# Patient Record
Sex: Female | Born: 1980 | Hispanic: No | Marital: Single | Smoking: Former smoker
Health system: Southern US, Community
[De-identification: ages and names within clinical notes are randomized; demographics above are authoritative.]

## PROBLEM LIST (undated history)

## (undated) ENCOUNTER — Inpatient Hospital Stay (HOSPITAL_COMMUNITY): Payer: Self-pay

## (undated) DIAGNOSIS — N390 Urinary tract infection, site not specified: Secondary | ICD-10-CM

## (undated) DIAGNOSIS — N83209 Unspecified ovarian cyst, unspecified side: Secondary | ICD-10-CM

## (undated) DIAGNOSIS — N739 Female pelvic inflammatory disease, unspecified: Secondary | ICD-10-CM

## (undated) DIAGNOSIS — T7840XA Allergy, unspecified, initial encounter: Secondary | ICD-10-CM

## (undated) DIAGNOSIS — Z309 Encounter for contraceptive management, unspecified: Secondary | ICD-10-CM

## (undated) DIAGNOSIS — D649 Anemia, unspecified: Secondary | ICD-10-CM

## (undated) DIAGNOSIS — A749 Chlamydial infection, unspecified: Secondary | ICD-10-CM

## (undated) HISTORY — DX: Unspecified ovarian cyst, unspecified side: N83.209

## (undated) HISTORY — PX: DILATION AND CURETTAGE OF UTERUS: SHX78

## (undated) HISTORY — PX: TONSILLECTOMY: SUR1361

## (undated) HISTORY — DX: Allergy, unspecified, initial encounter: T78.40XA

## (undated) HISTORY — DX: Anemia, unspecified: D64.9

## (undated) HISTORY — DX: Urinary tract infection, site not specified: N39.0

## (undated) HISTORY — PX: HERNIA REPAIR: SHX51

## (undated) HISTORY — DX: Encounter for contraceptive management, unspecified: Z30.9

## (undated) HISTORY — DX: Female pelvic inflammatory disease, unspecified: N73.9

## (undated) HISTORY — DX: Chlamydial infection, unspecified: A74.9

---
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
## 2010-07-01 ENCOUNTER — Inpatient Hospital Stay (HOSPITAL_COMMUNITY)
Admission: AD | Admit: 2010-07-01 | Discharge: 2010-07-01 | Disposition: A | Discharge status: Home / Self Care | Payer: BC Managed Care – PPO | Source: Ambulatory Visit | Attending: Obstetrics and Gynecology | Admitting: Obstetrics and Gynecology

## 2010-07-01 DIAGNOSIS — N949 Unspecified condition associated with female genital organs and menstrual cycle: Secondary | ICD-10-CM | POA: Insufficient documentation

## 2010-07-01 DIAGNOSIS — O2390 Unspecified genitourinary tract infection in pregnancy, unspecified trimester: Secondary | ICD-10-CM | POA: Insufficient documentation

## 2010-07-01 DIAGNOSIS — A499 Bacterial infection, unspecified: Secondary | ICD-10-CM

## 2010-07-01 DIAGNOSIS — N760 Acute vaginitis: Secondary | ICD-10-CM

## 2010-07-01 DIAGNOSIS — B9689 Other specified bacterial agents as the cause of diseases classified elsewhere: Secondary | ICD-10-CM | POA: Insufficient documentation

## 2010-07-01 LAB — URINALYSIS, ROUTINE W REFLEX MICROSCOPIC
Bilirubin Urine: NEGATIVE
Ketones, ur: NEGATIVE mg/dL
Specific Gravity, Urine: 1.02 (ref 1.005–1.030)
pH: 6.5 (ref 5.0–8.0)

## 2010-07-01 LAB — CBC
Platelets: 255 10*3/uL (ref 150–400)
RBC: 3.38 MIL/uL — ABNORMAL LOW (ref 3.87–5.11)
WBC: 10.1 10*3/uL (ref 4.0–10.5)

## 2010-07-01 LAB — URINE MICROSCOPIC-ADD ON

## 2010-07-12 ENCOUNTER — Other Ambulatory Visit: Payer: Self-pay | Admitting: Obstetrics and Gynecology

## 2010-07-12 ENCOUNTER — Other Ambulatory Visit: Payer: Self-pay

## 2010-07-12 DIAGNOSIS — O0930 Supervision of pregnancy with insufficient antenatal care, unspecified trimester: Secondary | ICD-10-CM

## 2010-07-12 DIAGNOSIS — O2690 Pregnancy related conditions, unspecified, unspecified trimester: Secondary | ICD-10-CM

## 2010-07-12 LAB — POCT URINALYSIS DIPSTICK
Bilirubin Urine: NEGATIVE
Glucose, UA: NEGATIVE mg/dL
Ketones, ur: NEGATIVE mg/dL
Specific Gravity, Urine: 1.02 (ref 1.005–1.030)

## 2010-07-19 ENCOUNTER — Encounter: Payer: Self-pay | Admitting: Obstetrics & Gynecology

## 2010-07-19 ENCOUNTER — Other Ambulatory Visit: Payer: Self-pay

## 2010-07-19 DIAGNOSIS — O09299 Supervision of pregnancy with other poor reproductive or obstetric history, unspecified trimester: Secondary | ICD-10-CM

## 2010-07-19 LAB — POCT URINALYSIS DIPSTICK
Bilirubin Urine: NEGATIVE
Glucose, UA: NEGATIVE mg/dL
Hgb urine dipstick: NEGATIVE
Ketones, ur: NEGATIVE mg/dL
Nitrite: NEGATIVE
Specific Gravity, Urine: 1.015 (ref 1.005–1.030)

## 2010-08-02 ENCOUNTER — Other Ambulatory Visit: Payer: Self-pay | Admitting: Obstetrics & Gynecology

## 2010-08-02 ENCOUNTER — Inpatient Hospital Stay (HOSPITAL_COMMUNITY)
Admission: AD | Admit: 2010-08-02 | Discharge: 2010-08-02 | Disposition: A | Discharge status: Home / Self Care | Payer: 59 | Source: Ambulatory Visit | Attending: Obstetrics & Gynecology | Admitting: Obstetrics & Gynecology

## 2010-08-02 ENCOUNTER — Encounter: Payer: Self-pay | Admitting: Family Medicine

## 2010-08-02 DIAGNOSIS — O4700 False labor before 37 completed weeks of gestation, unspecified trimester: Secondary | ICD-10-CM

## 2010-08-02 DIAGNOSIS — O09299 Supervision of pregnancy with other poor reproductive or obstetric history, unspecified trimester: Secondary | ICD-10-CM

## 2010-08-02 DIAGNOSIS — O368190 Decreased fetal movements, unspecified trimester, not applicable or unspecified: Secondary | ICD-10-CM

## 2010-08-02 DIAGNOSIS — O09219 Supervision of pregnancy with history of pre-term labor, unspecified trimester: Secondary | ICD-10-CM

## 2010-08-02 LAB — CONVERTED CEMR LAB
HCT: 32.7 % — ABNORMAL LOW (ref 36.0–46.0)
HIV: NONREACTIVE
MCHC: 32.1 g/dL (ref 30.0–36.0)
MCV: 90.3 fL (ref 78.0–100.0)
Platelets: 322 10*3/uL (ref 150–400)
RBC: 3.62 M/uL — ABNORMAL LOW (ref 3.87–5.11)

## 2010-08-02 LAB — URINALYSIS, ROUTINE W REFLEX MICROSCOPIC
Bilirubin Urine: NEGATIVE
Nitrite: NEGATIVE
Specific Gravity, Urine: 1.01 (ref 1.005–1.030)
pH: 6 (ref 5.0–8.0)

## 2010-08-02 LAB — POCT URINALYSIS DIP (DEVICE)
Bilirubin Urine: NEGATIVE
Ketones, ur: NEGATIVE mg/dL
pH: 8 (ref 5.0–8.0)

## 2010-08-03 ENCOUNTER — Other Ambulatory Visit: Payer: Self-pay | Admitting: Obstetrics and Gynecology

## 2010-08-03 ENCOUNTER — Ambulatory Visit (HOSPITAL_COMMUNITY)
Admission: RE | Admit: 2010-08-03 | Discharge: 2010-08-03 | Disposition: A | Discharge status: Home / Self Care | Payer: 59 | Source: Ambulatory Visit | Attending: Obstetrics and Gynecology | Admitting: Obstetrics and Gynecology

## 2010-08-03 ENCOUNTER — Other Ambulatory Visit (HOSPITAL_COMMUNITY): Payer: 59

## 2010-08-03 DIAGNOSIS — O358XX0 Maternal care for other (suspected) fetal abnormality and damage, not applicable or unspecified: Secondary | ICD-10-CM | POA: Insufficient documentation

## 2010-08-03 DIAGNOSIS — O09299 Supervision of pregnancy with other poor reproductive or obstetric history, unspecified trimester: Secondary | ICD-10-CM

## 2010-08-03 DIAGNOSIS — O2690 Pregnancy related conditions, unspecified, unspecified trimester: Secondary | ICD-10-CM

## 2010-08-03 DIAGNOSIS — O352XX0 Maternal care for (suspected) hereditary disease in fetus, not applicable or unspecified: Secondary | ICD-10-CM

## 2010-08-03 DIAGNOSIS — O09219 Supervision of pregnancy with history of pre-term labor, unspecified trimester: Secondary | ICD-10-CM | POA: Insufficient documentation

## 2010-08-03 DIAGNOSIS — Z363 Encounter for antenatal screening for malformations: Secondary | ICD-10-CM | POA: Insufficient documentation

## 2010-08-03 DIAGNOSIS — Z1389 Encounter for screening for other disorder: Secondary | ICD-10-CM | POA: Insufficient documentation

## 2010-08-06 ENCOUNTER — Inpatient Hospital Stay (HOSPITAL_COMMUNITY)
Admission: AD | Admit: 2010-08-06 | Discharge: 2010-08-06 | Disposition: A | Discharge status: Home / Self Care | Payer: 59 | Source: Ambulatory Visit | Attending: Obstetrics & Gynecology | Admitting: Obstetrics & Gynecology

## 2010-08-06 ENCOUNTER — Other Ambulatory Visit: Payer: 59

## 2010-08-06 DIAGNOSIS — R000 Tachycardia, unspecified: Secondary | ICD-10-CM

## 2010-08-06 DIAGNOSIS — O99891 Other specified diseases and conditions complicating pregnancy: Secondary | ICD-10-CM | POA: Insufficient documentation

## 2010-08-06 DIAGNOSIS — O9989 Other specified diseases and conditions complicating pregnancy, childbirth and the puerperium: Secondary | ICD-10-CM

## 2010-08-06 LAB — TSH: TSH: 0.521 u[IU]/mL (ref 0.350–4.500)

## 2010-08-06 LAB — COMPREHENSIVE METABOLIC PANEL
BUN: 7 mg/dL (ref 6–23)
Calcium: 9.1 mg/dL (ref 8.4–10.5)
GFR calc non Af Amer: >60 mL/min (ref >60)
Glucose, Bld: 97 mg/dL (ref 70–99)
Total Protein: 6.5 g/dL (ref 6.0–8.3)

## 2010-08-06 LAB — URINALYSIS, ROUTINE W REFLEX MICROSCOPIC
Nitrite: NEGATIVE
Specific Gravity, Urine: 1.025 (ref 1.005–1.030)
pH: 6 (ref 5.0–8.0)

## 2010-08-06 LAB — CBC
MCV: 88.8 fL (ref 78.0–100.0)
Platelets: 270 10*3/uL (ref 150–400)
RBC: 3.4 MIL/uL — ABNORMAL LOW (ref 3.87–5.11)
WBC: 11.4 10*3/uL — ABNORMAL HIGH (ref 4.0–10.5)

## 2010-08-06 LAB — URINE MICROSCOPIC-ADD ON

## 2010-08-08 LAB — URINE CULTURE
Colony Count: 75000
Culture  Setup Time: 201204021514

## 2010-08-09 ENCOUNTER — Other Ambulatory Visit (HOSPITAL_COMMUNITY): Payer: 59

## 2010-08-09 ENCOUNTER — Other Ambulatory Visit: Payer: 59

## 2010-08-09 ENCOUNTER — Ambulatory Visit (HOSPITAL_COMMUNITY)
Admission: RE | Admit: 2010-08-09 | Discharge: 2010-08-09 | Disposition: A | Discharge status: Home / Self Care | Payer: 59 | Source: Ambulatory Visit | Attending: Obstetrics and Gynecology | Admitting: Obstetrics and Gynecology

## 2010-08-09 DIAGNOSIS — O09299 Supervision of pregnancy with other poor reproductive or obstetric history, unspecified trimester: Secondary | ICD-10-CM

## 2010-08-09 DIAGNOSIS — O2690 Pregnancy related conditions, unspecified, unspecified trimester: Secondary | ICD-10-CM

## 2010-08-16 ENCOUNTER — Ambulatory Visit (HOSPITAL_COMMUNITY): Payer: 59

## 2010-08-16 ENCOUNTER — Ambulatory Visit (HOSPITAL_COMMUNITY)
Admission: RE | Admit: 2010-08-16 | Discharge: 2010-08-16 | Disposition: A | Discharge status: Home / Self Care | Payer: 59 | Source: Ambulatory Visit | Attending: Obstetrics and Gynecology | Admitting: Obstetrics and Gynecology

## 2010-08-16 ENCOUNTER — Other Ambulatory Visit: Payer: Self-pay | Admitting: Family Medicine

## 2010-08-16 ENCOUNTER — Encounter (HOSPITAL_COMMUNITY): Payer: Self-pay

## 2010-08-16 ENCOUNTER — Other Ambulatory Visit: Payer: Self-pay | Admitting: Obstetrics & Gynecology

## 2010-08-16 DIAGNOSIS — O09219 Supervision of pregnancy with history of pre-term labor, unspecified trimester: Secondary | ICD-10-CM

## 2010-08-16 DIAGNOSIS — O09299 Supervision of pregnancy with other poor reproductive or obstetric history, unspecified trimester: Secondary | ICD-10-CM

## 2010-08-16 DIAGNOSIS — O352XX0 Maternal care for (suspected) hereditary disease in fetus, not applicable or unspecified: Secondary | ICD-10-CM | POA: Insufficient documentation

## 2010-08-16 LAB — POCT URINALYSIS DIP (DEVICE)
Hgb urine dipstick: NEGATIVE
Ketones, ur: NEGATIVE mg/dL
Protein, ur: NEGATIVE mg/dL
pH: 7 (ref 5.0–8.0)

## 2010-08-20 ENCOUNTER — Inpatient Hospital Stay (HOSPITAL_COMMUNITY)
Admission: AD | Admit: 2010-08-20 | Discharge: 2010-08-20 | Disposition: A | Discharge status: Home / Self Care | Payer: 59 | Source: Ambulatory Visit | Attending: Obstetrics & Gynecology | Admitting: Obstetrics & Gynecology

## 2010-08-20 DIAGNOSIS — O4700 False labor before 37 completed weeks of gestation, unspecified trimester: Secondary | ICD-10-CM | POA: Insufficient documentation

## 2010-08-20 LAB — URINE MICROSCOPIC-ADD ON

## 2010-08-20 LAB — URINALYSIS, ROUTINE W REFLEX MICROSCOPIC
Bilirubin Urine: NEGATIVE
Ketones, ur: NEGATIVE mg/dL
Nitrite: NEGATIVE
Specific Gravity, Urine: 1.01 (ref 1.005–1.030)
pH: 6.5 (ref 5.0–8.0)

## 2010-08-23 ENCOUNTER — Ambulatory Visit (HOSPITAL_COMMUNITY)
Admission: RE | Admit: 2010-08-23 | Discharge: 2010-08-23 | Disposition: A | Discharge status: Home / Self Care | Payer: 59 | Source: Ambulatory Visit | Attending: Obstetrics and Gynecology | Admitting: Obstetrics and Gynecology

## 2010-08-23 ENCOUNTER — Other Ambulatory Visit: Payer: Self-pay | Admitting: Obstetrics & Gynecology

## 2010-08-23 DIAGNOSIS — O09299 Supervision of pregnancy with other poor reproductive or obstetric history, unspecified trimester: Secondary | ICD-10-CM

## 2010-08-23 DIAGNOSIS — M79609 Pain in unspecified limb: Secondary | ICD-10-CM

## 2010-08-23 DIAGNOSIS — O09219 Supervision of pregnancy with history of pre-term labor, unspecified trimester: Secondary | ICD-10-CM | POA: Insufficient documentation

## 2010-08-23 DIAGNOSIS — O99891 Other specified diseases and conditions complicating pregnancy: Secondary | ICD-10-CM | POA: Insufficient documentation

## 2010-08-23 DIAGNOSIS — O352XX0 Maternal care for (suspected) hereditary disease in fetus, not applicable or unspecified: Secondary | ICD-10-CM | POA: Insufficient documentation

## 2010-08-23 DIAGNOSIS — M7989 Other specified soft tissue disorders: Secondary | ICD-10-CM | POA: Insufficient documentation

## 2010-08-23 LAB — POCT URINALYSIS DIP (DEVICE)
Bilirubin Urine: NEGATIVE
Ketones, ur: NEGATIVE mg/dL
Nitrite: NEGATIVE

## 2010-08-28 ENCOUNTER — Inpatient Hospital Stay (HOSPITAL_COMMUNITY)
Admission: AD | Admit: 2010-08-28 | Discharge: 2010-08-28 | Disposition: A | Discharge status: Home / Self Care | Payer: 59 | Source: Ambulatory Visit | Attending: Obstetrics & Gynecology | Admitting: Obstetrics & Gynecology

## 2010-08-28 DIAGNOSIS — O4700 False labor before 37 completed weeks of gestation, unspecified trimester: Secondary | ICD-10-CM | POA: Insufficient documentation

## 2010-08-28 DIAGNOSIS — D649 Anemia, unspecified: Secondary | ICD-10-CM | POA: Insufficient documentation

## 2010-08-28 DIAGNOSIS — O99019 Anemia complicating pregnancy, unspecified trimester: Secondary | ICD-10-CM | POA: Insufficient documentation

## 2010-08-28 LAB — CBC
Hemoglobin: 9.4 g/dL — ABNORMAL LOW (ref 12.0–15.0)
MCH: 28.7 pg (ref 26.0–34.0)
MCHC: 32.4 g/dL (ref 30.0–36.0)
Platelets: 296 10*3/uL (ref 150–400)
RBC: 3.28 MIL/uL — ABNORMAL LOW (ref 3.87–5.11)

## 2010-08-28 LAB — URINALYSIS, ROUTINE W REFLEX MICROSCOPIC
Bilirubin Urine: NEGATIVE
Ketones, ur: 15 mg/dL — AB
Nitrite: NEGATIVE
Specific Gravity, Urine: 1.01 (ref 1.005–1.030)
Urobilinogen, UA: 0.2 mg/dL (ref 0.0–1.0)
pH: 6 (ref 5.0–8.0)

## 2010-08-29 LAB — GLUCOSE, CAPILLARY: Glucose-Capillary: 112 mg/dL — ABNORMAL HIGH (ref 70–99)

## 2010-08-30 ENCOUNTER — Ambulatory Visit (HOSPITAL_COMMUNITY): Payer: 59

## 2010-08-30 ENCOUNTER — Ambulatory Visit (HOSPITAL_COMMUNITY)
Admission: RE | Admit: 2010-08-30 | Discharge: 2010-08-30 | Disposition: A | Discharge status: Home / Self Care | Payer: 59 | Source: Ambulatory Visit | Attending: Obstetrics and Gynecology | Admitting: Obstetrics and Gynecology

## 2010-08-30 ENCOUNTER — Other Ambulatory Visit: Payer: Self-pay | Admitting: Obstetrics and Gynecology

## 2010-08-30 DIAGNOSIS — O09219 Supervision of pregnancy with history of pre-term labor, unspecified trimester: Secondary | ICD-10-CM

## 2010-08-30 DIAGNOSIS — O09299 Supervision of pregnancy with other poor reproductive or obstetric history, unspecified trimester: Secondary | ICD-10-CM

## 2010-08-30 DIAGNOSIS — O352XX0 Maternal care for (suspected) hereditary disease in fetus, not applicable or unspecified: Secondary | ICD-10-CM | POA: Insufficient documentation

## 2010-08-30 DIAGNOSIS — Z331 Pregnant state, incidental: Secondary | ICD-10-CM

## 2010-09-03 ENCOUNTER — Other Ambulatory Visit: Payer: 59

## 2010-09-03 DIAGNOSIS — O09299 Supervision of pregnancy with other poor reproductive or obstetric history, unspecified trimester: Secondary | ICD-10-CM

## 2010-09-06 ENCOUNTER — Other Ambulatory Visit: Payer: 59

## 2010-09-06 ENCOUNTER — Other Ambulatory Visit: Payer: Self-pay | Admitting: Obstetrics and Gynecology

## 2010-09-06 DIAGNOSIS — O26879 Cervical shortening, unspecified trimester: Secondary | ICD-10-CM

## 2010-09-06 DIAGNOSIS — E669 Obesity, unspecified: Secondary | ICD-10-CM

## 2010-09-06 DIAGNOSIS — O09299 Supervision of pregnancy with other poor reproductive or obstetric history, unspecified trimester: Secondary | ICD-10-CM

## 2010-09-06 DIAGNOSIS — O99210 Obesity complicating pregnancy, unspecified trimester: Secondary | ICD-10-CM

## 2010-09-06 DIAGNOSIS — Z331 Pregnant state, incidental: Secondary | ICD-10-CM

## 2010-09-06 DIAGNOSIS — O09219 Supervision of pregnancy with history of pre-term labor, unspecified trimester: Secondary | ICD-10-CM

## 2010-09-06 LAB — POCT URINALYSIS DIP (DEVICE)
Glucose, UA: NEGATIVE mg/dL
Protein, ur: NEGATIVE mg/dL
Specific Gravity, Urine: >=1.03 (ref 1.005–1.030)
Urobilinogen, UA: 0.2 mg/dL (ref 0.0–1.0)

## 2010-09-07 ENCOUNTER — Encounter: Payer: Self-pay | Admitting: Internal Medicine

## 2010-09-07 ENCOUNTER — Encounter: Payer: Self-pay | Admitting: *Deleted

## 2010-09-10 ENCOUNTER — Other Ambulatory Visit: Payer: 59

## 2010-09-10 ENCOUNTER — Encounter: Payer: Self-pay | Admitting: Internal Medicine

## 2010-09-10 ENCOUNTER — Other Ambulatory Visit: Payer: Self-pay | Admitting: Obstetrics & Gynecology

## 2010-09-10 ENCOUNTER — Ambulatory Visit (INDEPENDENT_AMBULATORY_CARE_PROVIDER_SITE_OTHER): Payer: 59 | Admitting: Internal Medicine

## 2010-09-10 DIAGNOSIS — R42 Dizziness and giddiness: Secondary | ICD-10-CM

## 2010-09-10 DIAGNOSIS — Z331 Pregnant state, incidental: Secondary | ICD-10-CM

## 2010-09-10 DIAGNOSIS — R0602 Shortness of breath: Secondary | ICD-10-CM

## 2010-09-10 DIAGNOSIS — O09299 Supervision of pregnancy with other poor reproductive or obstetric history, unspecified trimester: Secondary | ICD-10-CM

## 2010-09-10 LAB — POCT URINALYSIS DIP (DEVICE)
Bilirubin Urine: NEGATIVE
Glucose, UA: NEGATIVE mg/dL
Nitrite: NEGATIVE
Urobilinogen, UA: 0.2 mg/dL (ref 0.0–1.0)

## 2010-09-10 NOTE — Progress Notes (Signed)
HPI Patient is a 30 year old who is referred for evaluation of palpitaitons and presyncope.. The patient is currently in the 33 wk of her pregnancy.  This is her 5th pregnancy.  Only one other went to term (2007).  She has not had problmes with the other pregnancies. She said about 1 month month she began feeling her heart race.  She has had dizziness.  One of the worst spells occurred when she was sitting at her desk at work.  Taken to ER.  Nothing found.  Sent home.  2nd spell of presyncope also occurred at work.  She has had other spells at home.  One occurred while standing. Episodes occur rapidly.  Prodrome is sudden warmth then dizziness.  Palpitations appear to follow.  No syncope. She says she is drinking aduaqute fluids. No Known Allergies  Current Outpatient Prescriptions  Medication Sig Dispense Refill  . ferrous sulfate 325 (65 FE) MG tablet Take 325 mg by mouth daily with breakfast.        . Prenatal MV-Min-Fe Fum-FA-DHA (PRENATAL 1 PO) 1 tab po qd         Past Medical History  Diagnosis Date  . Anemia   . Bipolar disorder   . UTI (urinary tract infection)   . Chlamydia   . PID (pelvic inflammatory disease)     Past Surgical History  Procedure Date  . Appendectomy   . Dilation and curettage of uterus   . Hernia repair   . Tonsillectomy     Family History  Problem Relation Age of Onset  . Depression    . Cancer    . Thyroid disease      History   Social History  . Marital Status: Single    Spouse Name: N/A    Number of Children: N/A  . Years of Education: N/A   Occupational History  . Not on file.   Social History Main Topics  . Smoking status: Former Smoker    Quit date: 05/06/2005  . Smokeless tobacco: Not on file  . Alcohol Use: No  . Drug Use: No  . Sexually Active: Not on file   Other Topics Concern  . Not on file   Social History Narrative  . No narrative on file    Review of Systems:  All systems reviewed.  They are negative to the  above problem except as previously stated.  EKG:  St  102 bpm.  Vital Signs: BP 124/73  Pulse 124  Ht 5\' 7"  (1.702 m)  Wt 214 lb (97.07 kg)  BMI 33.52 kg/m2  Physical Exam Patient is in NAD  HEENT:  Normocephalic, atraumatic. EOMI, PERRLA.  Neck: JVP is normal. No thyromegaly. No bruits.  Lungs: clear to auscultation. No rales no wheezes.  Heart: Regular rate and rhythm. Normal S1, S2. No S3.   No significant murmurs. PMI not displaced.  Abdomen:  Distended with pregnancy.. Normal bowel sounds. . No hepatomegaly.  Extremities:   Good distal pulses throughout. No lower extremity edema.  Musculoskeletal :moving all extremities.  Neuro:   alert and oriented x3.  CN II-XII grossly intact.   Assessment and Plan:

## 2010-09-10 NOTE — Patient Instructions (Signed)
Your physician has requested that you have an echocardiogram. Echocardiography is a painless test that uses sound waves to create images of your heart. It provides your doctor with information about the size and shape of your heart and how well your heart's chambers and valves are working. This procedure takes approximately one hour. There are no restrictions for this procedure.  We will call you with the results of the test.  Your physician recommends that you schedule a follow-up appointment in: July 2012

## 2010-09-13 ENCOUNTER — Other Ambulatory Visit: Payer: 59

## 2010-09-13 ENCOUNTER — Other Ambulatory Visit: Payer: Self-pay | Admitting: Physician Assistant

## 2010-09-13 DIAGNOSIS — Z331 Pregnant state, incidental: Secondary | ICD-10-CM

## 2010-09-13 DIAGNOSIS — O09299 Supervision of pregnancy with other poor reproductive or obstetric history, unspecified trimester: Secondary | ICD-10-CM

## 2010-09-13 LAB — POCT URINALYSIS DIP (DEVICE)
Bilirubin Urine: NEGATIVE
Glucose, UA: NEGATIVE mg/dL
Ketones, ur: NEGATIVE mg/dL
Nitrite: NEGATIVE
pH: 7 (ref 5.0–8.0)

## 2010-09-17 ENCOUNTER — Other Ambulatory Visit: Payer: 59

## 2010-09-17 DIAGNOSIS — O09299 Supervision of pregnancy with other poor reproductive or obstetric history, unspecified trimester: Secondary | ICD-10-CM

## 2010-09-18 ENCOUNTER — Ambulatory Visit (HOSPITAL_COMMUNITY): Payer: 59 | Attending: Internal Medicine

## 2010-09-18 DIAGNOSIS — R0609 Other forms of dyspnea: Secondary | ICD-10-CM | POA: Insufficient documentation

## 2010-09-18 DIAGNOSIS — I059 Rheumatic mitral valve disease, unspecified: Secondary | ICD-10-CM | POA: Insufficient documentation

## 2010-09-18 DIAGNOSIS — R0989 Other specified symptoms and signs involving the circulatory and respiratory systems: Secondary | ICD-10-CM | POA: Insufficient documentation

## 2010-09-18 DIAGNOSIS — I079 Rheumatic tricuspid valve disease, unspecified: Secondary | ICD-10-CM | POA: Insufficient documentation

## 2010-09-18 DIAGNOSIS — R55 Syncope and collapse: Secondary | ICD-10-CM | POA: Insufficient documentation

## 2010-09-18 DIAGNOSIS — R0602 Shortness of breath: Secondary | ICD-10-CM

## 2010-09-18 DIAGNOSIS — I2510 Atherosclerotic heart disease of native coronary artery without angina pectoris: Secondary | ICD-10-CM | POA: Insufficient documentation

## 2010-09-18 DIAGNOSIS — R002 Palpitations: Secondary | ICD-10-CM | POA: Insufficient documentation

## 2010-09-18 DIAGNOSIS — R42 Dizziness and giddiness: Secondary | ICD-10-CM

## 2010-09-20 ENCOUNTER — Other Ambulatory Visit: Payer: 59

## 2010-09-20 ENCOUNTER — Other Ambulatory Visit: Payer: Self-pay | Admitting: Obstetrics & Gynecology

## 2010-09-20 DIAGNOSIS — O09299 Supervision of pregnancy with other poor reproductive or obstetric history, unspecified trimester: Secondary | ICD-10-CM

## 2010-09-20 LAB — POCT URINALYSIS DIP (DEVICE)
Bilirubin Urine: NEGATIVE
Glucose, UA: 250 mg/dL — AB
Ketones, ur: NEGATIVE mg/dL
Protein, ur: NEGATIVE mg/dL
Specific Gravity, Urine: 1.02 (ref 1.005–1.030)

## 2010-09-24 ENCOUNTER — Other Ambulatory Visit: Payer: 59

## 2010-09-24 DIAGNOSIS — O09299 Supervision of pregnancy with other poor reproductive or obstetric history, unspecified trimester: Secondary | ICD-10-CM

## 2010-09-27 ENCOUNTER — Ambulatory Visit (HOSPITAL_COMMUNITY)
Admission: RE | Admit: 2010-09-27 | Discharge: 2010-09-27 | Disposition: A | Discharge status: Home / Self Care | Payer: 59 | Source: Ambulatory Visit | Attending: Obstetrics and Gynecology | Admitting: Obstetrics and Gynecology

## 2010-09-27 ENCOUNTER — Other Ambulatory Visit: Payer: Self-pay | Admitting: Obstetrics and Gynecology

## 2010-09-27 ENCOUNTER — Other Ambulatory Visit: Payer: 59

## 2010-09-27 DIAGNOSIS — O09219 Supervision of pregnancy with history of pre-term labor, unspecified trimester: Secondary | ICD-10-CM | POA: Insufficient documentation

## 2010-09-27 DIAGNOSIS — O09299 Supervision of pregnancy with other poor reproductive or obstetric history, unspecified trimester: Secondary | ICD-10-CM | POA: Insufficient documentation

## 2010-09-27 DIAGNOSIS — O26879 Cervical shortening, unspecified trimester: Secondary | ICD-10-CM

## 2010-09-27 DIAGNOSIS — O352XX0 Maternal care for (suspected) hereditary disease in fetus, not applicable or unspecified: Secondary | ICD-10-CM

## 2010-09-27 LAB — POCT URINALYSIS DIP (DEVICE)
Bilirubin Urine: NEGATIVE
Glucose, UA: NEGATIVE mg/dL
Hgb urine dipstick: NEGATIVE
Specific Gravity, Urine: >=1.03 (ref 1.005–1.030)
Urobilinogen, UA: 0.2 mg/dL (ref 0.0–1.0)

## 2010-10-02 ENCOUNTER — Other Ambulatory Visit: Payer: 59

## 2010-10-04 ENCOUNTER — Telehealth: Payer: Self-pay | Admitting: *Deleted

## 2010-10-04 NOTE — Telephone Encounter (Signed)
LMOM for call back. 

## 2010-10-11 ENCOUNTER — Other Ambulatory Visit: Payer: 59

## 2010-10-11 ENCOUNTER — Other Ambulatory Visit: Payer: Self-pay | Admitting: Obstetrics & Gynecology

## 2010-10-11 DIAGNOSIS — O09299 Supervision of pregnancy with other poor reproductive or obstetric history, unspecified trimester: Secondary | ICD-10-CM

## 2010-10-11 LAB — POCT URINALYSIS DIP (DEVICE)
Bilirubin Urine: NEGATIVE
Glucose, UA: NEGATIVE mg/dL
Ketones, ur: NEGATIVE mg/dL
Protein, ur: NEGATIVE mg/dL
Urobilinogen, UA: 0.2 mg/dL (ref 0.0–1.0)
pH: 6.5 (ref 5.0–8.0)

## 2010-10-15 ENCOUNTER — Other Ambulatory Visit: Payer: 59

## 2010-10-15 DIAGNOSIS — O09299 Supervision of pregnancy with other poor reproductive or obstetric history, unspecified trimester: Secondary | ICD-10-CM

## 2010-10-18 ENCOUNTER — Inpatient Hospital Stay (HOSPITAL_COMMUNITY)
Admission: RE | Admit: 2010-10-18 | Discharge: 2010-10-20 | DRG: 775 | Disposition: A | Discharge status: Home / Self Care | Payer: 59 | Source: Ambulatory Visit | Attending: Obstetrics & Gynecology | Admitting: Obstetrics & Gynecology

## 2010-10-18 LAB — CBC
MCHC: 33 g/dL (ref 30.0–36.0)
MCV: 85.1 fL (ref 78.0–100.0)
Platelets: 292 10*3/uL (ref 150–400)
RDW: 14.1 % (ref 11.5–15.5)
WBC: 9.3 10*3/uL (ref 4.0–10.5)

## 2010-11-03 DIAGNOSIS — E669 Obesity, unspecified: Secondary | ICD-10-CM

## 2010-11-15 ENCOUNTER — Encounter: Payer: Self-pay | Admitting: Physician Assistant

## 2010-11-15 ENCOUNTER — Ambulatory Visit (INDEPENDENT_AMBULATORY_CARE_PROVIDER_SITE_OTHER): Payer: 59 | Admitting: Physician Assistant

## 2010-11-15 DIAGNOSIS — Z309 Encounter for contraceptive management, unspecified: Secondary | ICD-10-CM

## 2010-11-15 NOTE — Patient Instructions (Signed)
Breastfeeding Challenges Breastfeeding is often the best way to feed your baby. Challenges may discourage you from breastfeeding. But solutions can usually be found to help you. Some babies have conditions that may interfere with or make breastfeeding more difficult. But, in all of the following cases, breastfeeding is still best for a baby's health.  ADVANTAGES OF BREASTFEEDING  Breast-fed babies tend to be healthier and less affected by disease.   Breast-fed babies may have better brain development and be less likely to be overweight than formula-fed babies.   Breastfeeding also benefits the mother. It will give you time to be close to your baby and helps create a strong bond. It also:   Delays the return of your periods.   Stimulates your uterus to contract back to normal.   Helps you lose some of the weight you gained during pregnancy.   Breastfeeding is also cheaper than formula feeding. It also does not require mixing formula, heating bottles, or washing extra dishes.   Breastfeeding mothers have a lower risk of developing breast cancer.   Breastfeeding should be encouraged in women with gestational diabetes and diabetes types I & II.  BREASTFEEDING CHALLENGES  Breastfeeding involves taking the time to get to know your baby's patterns and responding to his cues. Once breastfeeding is well established, feedings usually become more regular and more widely spaced. Some mothers do not nurse their babies because they come across problems early on. If at all possible, begin breastfeeding your baby within an hour after delivery. The first milk you produce is called colostrum. It is packed with nutrients and disease-fighting substances. These will help nourish and protect your baby against infections as he or she grows up.   Some babies are unable to breastfeed because of premature birth and small size along with weakness and difficulty sucking. Sometimes with birth defects of the mouth (cleft  lip or cleft palate) the mother may be able to pump breastmilk to give to her baby. Some digestive problems (breast milk jaundice, galactosemia) may be reasons not to nurse. See a lactation consultant if you have a breast infection or breast abscess, breast cancer or other cancer, previous surgery or radiation treatment, or inadequate milk supply (uncommon).  SOME MOTHERS ARE ADVISED NOT TO BREASTFEED DUE TO HEALTH PROBLEMS SUCH AS:  Serious illnesses.  Severe malnutrition.   Undergoing Radiation therapy.   Taking psychiatric medication.   Active herpes lesions on the breast.  Chicken pox or Shingles.   Active, untreated tuberculosis.   HIV (human immunodeficiency virus) infection.   Drug/alcohol addiction.   Undergoing radioactive iodine therapy.   Leukemia human cell Type I/II.   BREASTFEEDING SHOULD NOT BE PAINFUL  It is natural for minor problems to arise in first time breastfeeding. Problems you may have and some solutions are as follows:   Nipple soreness may be caused by:   Improper position of baby.   Improper feeding techniques.   Improper nipple care.   For many women, there is no identified cause. A simple change in your baby's position while feeding may relieve nipple soreness. Some breastfeeding mothers report nipple soreness only during the initial adjustment period.   If there is tenderness at first, it should gradually go away as the days go by. Poor latch-on and positioning are common causes of sore nipples. This is usually because the baby is not getting enough of the areola into his or her mouth, and is sucking mostly on the nipple. The areola is the colored portion  around the nipple. In general, nurse early and often. Nurse with the nipple and areola in the baby's mouth, not just the nipple. And feed your baby on demand.   If you have sore nipples and put off feedings because of the pain, this can lead to your breasts becoming overly full. This may lead to  plugged milk ducts in the breast followed by engorgement or even infection of the breasts. If your baby is latched on correctly, he/she should be able to nurse as long as needed without causing any pain. If it hurts, take the baby off of your breast and try again. Ask for help if it is still painful for you.   Check the positioning of your baby's body and the way she latches on and sucks. To minimize soreness, your baby's mouth should be open wide with as much of the areola in his or her mouth as possible. You should find that it feels better right away once the baby is positioned correctly.   Do not delay feedings. Try to relax so your let-down reflex comes easily. You also can hand-express a little milk before beginning the feeding so your baby does not clamp down harder, waiting for the milk to come.   If your nipples are very sore, it may be helpful to change positions each time you nurse. This puts the pressure on a different part of the nipple.   After nursing, you can also express a few drops of milk and gently rub it on your nipples. Human milk has natural healing properties. Let your nipples air-dry after feeding, or wear a soft-cotton shirt.   Wearing a nipple shield during nursing will not relieve sore nipples. They actually can prolong soreness by making it hard for the baby to learn to nurse without the shield.   Avoid wearing bras or clothes that are too tight and put pressure on your nipples. If you wear a bra, get one that offers good support to your breasts.   Change nursing pads often to avoid trapping in moisture. Only use cotton pads.   Avoid using soap, ointments or other chemicals on your nipples. Make sure to avoid products that must be removed before nursing. Washing with clean water is all that is necessary to keep your nipples and breasts clean.   Try rubbing pure lanolin on your nipples after breastfeeding to soothe the pain.   Making sure you get enough rest, eating  healthy foods, and getting enough fluids also can help the healing process. If you have very sore nipples, you can ask your doctor about using non-aspirin pain relievers.   Another cause of sore nursing is a condition called thrush. This is a fungal infection that can form on your nipples from the milk. Other signs of thrush include itching, flaking and drying skin, tender or pink skin. The infection also can form in the baby's mouth from having contact with your nipples. There it appears as little white spots on the inside of the cheeks, gums, or tongue. It also can appear as a diaper rash on your baby that will not go away by using regular diaper rash ointments. If you have any of these symptoms or think you have thrush, contact your doctor and your baby's doctor, or a Advertising copywriter. A lactation consultant is a breastfeeding Risk analyst. You can get medication for your nipples and for your baby.   If you still have sore nipples after following the above tips, you may  need to see someone who is trained in breastfeeding, like a Advertising copywriter.  ENGORGEMENT Engorgement is a condition after pregnancy, when your breasts feel very hard and painful. You also may have breast swelling, tenderness, warmth, redness, throbbing and flattening of the nipple. Engorgement may cause a low-grade fever. This can be confused with a breast infection. Engorgement is the result of the milk building up. It usually happens during the third to fifth day after birth. This slows circulation. When blood and lymph move through the breasts, fluid from the blood vessels can seep into the breast tissues. All of the following can cause engorgement.  Poor positioning.   Infrequent feedings.   Giving supplementary bottles of water, juice, formula, or breast milk or using a pacifier. All of these cut down on your feeding and may lead to engorgement.   Changing the breastfeeding schedule with decreasing in feeding.    The baby changes the nursing pattern.  Having a baby with a weak suck who is not able to nurse effectively.   Fatigue, stress, or anemia in the mother.   An overabundant milk supply.   Nipple damage.   Breast abnormalities.   Engorgement can lead to plugged ducts or a breast infection. So it is important to try to prevent it before this happens. If treated properly, engorgement should only last for one to two days.  Minimize engorgement by making sure the baby is latched on and positioned correctly at your breast.   Nurse frequently after birth. Allow the baby to nurse as long as he/she likes, as long as he/she is latched on well and sucking effectively.   In the early days when your milk is coming in, you should awaken a sleepy baby every 2 to 3 hours to breastfeed. Breastfeeding often on the affected side helps to remove the milk and keeps milk moving freely. This prevents overfilling of the breast.   Avoid additional bottles and pacifiers.   Try hand expressing or pumping a little milk to first soften the breast, areola, and nipple before breastfeeding. Or massage the breast before feeding.   Cold compresses in between feedings can help ease pain and swelling.   If you are returning to work, try to pump your milk on the same schedule your baby was fed.   Eat a well balanced diet and drink plenty of fluids.   Use a well-fitting, supportive bra that is not too tight.   If your engorgement lasts for more than two days even after treating it, contact a Advertising copywriter.   Use a breast pump to keep up with your nursing schedule.   Use a breast pump if your baby is not taking enough milk or you feel you may be getting engorged.  PLUGGED DUCTS AND INFECTION Plugged ducts and breast infection (mastitis) are common sources of sore breasts postpartum. It is common for many women to have a plugged duct in the breast at some point if breastfeeding.   A plugged milk duct feels like  a tender, sore, lump in the breast. It is not accompanied by a fever or other symptoms. It happens when a milk duct does not properly drain. Then, pressure builds up behind the plug, and surrounding tissue becomes inflamed. A plugged duct usually only occurs in one breast at a time.   A breast infection (mastitis), on the other hand, is soreness or a lump in the breast that can be accompanied by a fever and/or flu-like symptoms. You may feel  run down or very achy. Some women with a breast infection also have nausea and vomiting. You also may have yellowish discharge from the nipple that looks like colostrum. Or the breasts feel warm or hot to the touch and appear pink or red. A breast infection can occur when other family members have a cold or the flu. Like a plugged duct, it usually only occurs in one breast. It is not always easy to tell the difference between a breast infection and a plugged duct. Both have similar symptoms and can improve within 24 to 48 hours.   Treatment for plugged ducts and breast infections is similar. But some breast infections need to also be treated with an antibiotic.   If mastitis is not treated quickly, it may lead to a breast abscess.   It may help to massage the area, starting behind the sore spot. Use your fingers in a circular motion and massage toward the nipple.   Breastfeed more often on the affected side. This helps loosen the plug, keeps the milk moving freely, and the breast from becoming overly full. Nursing every two hours, both day and night on the affected side first can be helpful.   Getting extra sleep or relaxing with your feet up can help speed healing. Often a plugged duct or breast infection is the first sign that a mother is doing too much and becoming overly tired.   Wear a well-fitting supportive bra that is not too tight, since this can constrict milk ducts.   If you do not feel better within 24 hours of trying these steps, and you have a fever  or your symptoms worsen, call your doctor. You may need an antibiotic. Also, if you have a breast infection in which both breasts look affected, or there is pus or blood in the milk, red streaks near the area, or your symptoms came on severely and suddenly, see your doctor right away.   Even if you need an antibiotic, continuing to breastfeed during treatment is best for both you and your baby. Most antibiotics will not affect your baby through your breast milk.  THRUSH Thrush (yeast) is a fungal infection that can form on your nipples or in your breast because it thrives on milk. The infection forms from an overgrowth of the candida organism. Candida usually exists in our bodies and is kept at healthy levels by the natural bacteria in our bodies. But, when the natural balance of bacteria is upset, candida can overgrow, causing an infection. Some of the things that can cause thrush include:   Having an overly moist environment on your skin or nipples that are sore or cracked.   Taking antibiotics, birth control pills or steroids.   Having a diet that contains large amounts of sugar or foods with yeast.   Having a chronic illness like HIV infection, diabetes, or anemia.  If you have sore nipples that last more than a few days even after you make sure your baby's latch and positioning is correct, or you suddenly get sore nipples after several weeks of unpainful nursing, you could have thrush. Some other signs of thrush include:   Pink, flaky, shiny, itchy or cracked nipples.   Deep pink and blistered nipples.   You also could have shooting pains deep in the breast during or after feedings, or achy breasts.  The infection also can form in your baby's mouth from having contact with your nipples, and appear as little white spots on the  inside of the cheeks, gums, or tongue. It also can appear as a diaper rash (small red dots around a rash) on your baby that will not go away by using regular diaper  rash ointments. Many babies with thrush refuse to nurse, or are gassy or cranky.  Solution:   If you or your baby have any of these symptoms, contact your doctor and your baby's doctor so you both can be correctly diagnosed.   You can get medication for your nipples and for your baby. Medication for a mother is usually an ointment for the nipples. Your baby can be given a liquid medication for his/her mouth, and/or an ointment for any diaper rash.   Change disposable nursing pads often. Wash any towels or clothing that come in contact with the yeast in very hot water (above 122 F or 50 C).   Wear a clean bra every day. Wash your hands often, and wash your baby's hands often, especially if he or she sucks on his/her fingers.   Only use cotton pads.   Boil any pacifiers, bottle nipples, or toys your baby puts in his or her mouth once a day for 20 minutes to kill the thrush. After one week of treatment, discard pacifiers and nipples and buy new ones.   Boil daily for 20 minutes all breast pump parts that touch the milk.   Make sure other family members are free of thrush or other fungal infections. If they have symptoms, get them treatment.  NURSING STRIKE A nursing strike is when your baby has been nursing well for months, then suddenly loses interest in breastfeeding and begins to refuse the breast. A nursing strike can mean several things are happening with your baby and that she or he is trying to communicate with you to let you know that something is wrong. Not all babies will react the same to different situations that can cause a nursing strike. Some will continue to breastfeed without a problem, others may just become fussy at the breast, and others will refuse the breast entirely. Some of the major causes of a nursing strike include:  Mouth pain from teething, a fungal infection, or a cold sore.   An ear infection.   Pain from a certain nursing position.   Being upset about a long  separation from the mother or a major change in routine.   Being interested in other things around him or her.   A cold or stuffy nose that makes breathing difficult.   Reduced milk supply from supplementing with bottles or overuse of a pacifier.   Responding to the mother's strong reaction if the baby has bitten her.   Being upset about hearing arguing or people talking in a harsh voice with other family members while nursing.   Reacting to stress, over-stimulation, or having been repeatedly put off when wanting to nurse.   If your baby is on a nursing strike, it is normal to feel frustrated and upset, especially if your baby is unhappy. It is important not to feel guilty or that you have done something wrong. Your breasts also may become uncomfortable as the milk builds up.  Treatment  Try to express your milk manually or with a breast pump on the same schedule as the baby used to breastfeed to avoid engorgement and plugged ducts.   Try another feeding method temporarily to give your baby your milk, such as a cup, dropper, or spoon. Keep track of your baby's wet diapers  to make sure he/she is getting enough milk (5 to 6 per day).   Keep offering your breast to the baby. If the baby is frustrated, stop and try again later. Try when the baby is sleeping or very sleepy.   Try various breastfeeding positions.   Focus on the baby with all of your attention and comfort him or her with extra touching and cuddling.   Try nursing while rocking and in a quiet room free of distractions.  INVERTED OR LARGE NIPPLES Some women have nipples that naturally are inverted, or that turn inward instead of protruding, or that are flat and do not protrude. Inverted or flat nipples can sometimes make it harder to breastfeed because your baby can have a harder time latching on. But remember that for breastfeeding to work, your baby has to latch on to both the nipple and the breast, so even inverted nipples can  work just fine. Very large nipples can make it hard for the baby to get enough of the areola into his or her mouth to compress the milk ducts and get enough milk.  Know what type of nipples you have before you have your baby, so you can be prepared in case you have a problem getting your baby to latch on correctly.   Talk with a lactation consultant at the hospital or at a breastfeeding clinic for extra help if you have flat, inverted, or very large nipples.   Sometimes a Advertising copywriter can help inverted nipples to be pulled out with a small device before your baby is brought to your breast.   In many cases, inverted nipples will protrude more as the baby starts to latch on and as time passes. The baby's sucking will help.   Flat nipples cause fewer problems than inverted nipples. Good latch-on and positioning are usually enough to ensure that a baby latched to a flat nipple breastfeeds well.   The latch for babies of mothers with very large nipples will improve with time as the baby grows. In some cases, it might take several weeks to get the baby to latch well. A good milk supply helps insure that her baby will get enough milk.  RETURNING TO WORK More and more women are breastfeeding when they return to work because they believe in the benefits of breastfeeding. You can purchase or rent effective breast pumps and storage containers for your milk. Many employers are willing to set up special rooms for mothers who pump. But others are not as educated about the benefits of breastfeeding. Also, many women are not able to take off as much time as they'd like after having their babies and might have to return to work before breastfeeding is well established. After you have your baby, take as much time off as possible. This will help you get your breastfeeding established well and may reduce the number of months you may need to pump your milk while you are at work.   If you plan to have your baby  take a bottle of expressed breast milk while you are at work, you can introduce your baby to a bottle when he or she is around four weeks old. Otherwise, the baby might not accept the bottle later on. Once your baby is comfortable taking a bottle, it is a good idea to have Dad or another family member offer a bottle of pumped breast milk on a regular basis so the baby stays in practice.   Let your employer  and/or Geophysical data processor know that you plan to continue breastfeeding once you return to work. Before you return to work, or even before you have your baby, start talking with your employer about breastfeeding. Do not be afraid to request a clean and private area where you can pump your milk. If you do not have your own office space, you can ask to use a supervisor's office during certain times. Or you can ask to have a clean, clutter free corner of a storage room. All you need is a chair, a small table, and an outlet if you are using an electric pump. Many electric pumps also can run on batteries and do not require an outlet. You can lock the door and place a small sign on it that asks for some privacy. You can pump your breast milk during lunch or other breaks. You could suggest to your employer that you are willing to make up work time for time spent pumping milk.   After pumping, you can refrigerate your milk, place it in a cooler, or freeze it for the baby to be fed later. Many breast pumps come with carrying cases that have a section to store your milk with ice packs. If you do not have access to a refrigerator, you can leave it at room temperatures for up to 6 hours.   Many employers are NOT aware of state laws that state they have to allow you to breastfeed at your job. Under these laws, your employer is required to set up a space for you to breastfeed and/or allow paid/unpaid time for breastfeeding employees. To see if your state has a breastfeeding law for employers, go to  https://www.warren.info/ or call us at 1-800-LALECHE (in [place]).  JAUNDICE  Jaundice is a condition that is common in many newborns. It appears as a yellowing of the skin and eyes. It is caused by an excess of bilirubin, a yellow pigment that is a product in the blood. All babies are born with extra red blood cells that undergo a process of being broken down and eliminated from the body. Bilirubin levels in the blood can be high because of:  Higher production of it in a newborn.   Increased ability of the newborn intestine to absorb it.   Limited ability of the newborn liver to handle large amounts of it.  Many cases of jaundice do not need to be treated. Your baby's doctor will carefully monitor your baby's bilirubin levels. Sometimes infants have to be temporarily separated from their mothers to receive special treatment with phototherapy (aiming lights on the baby). In these cases, breastfeeding is encouraged but supplements may also be given to the baby. American Academy of Pediatrics advises against stopping breastfeeding in jaundiced babies and suggests continuing frequent breastfeeding, even during treatment. If your baby is jaundiced or develops jaundice, it is important to discuss with your baby's caregiver all possible treatment options. Share that you do not want to interrupt nursing if this is at all possible.  REFLUX  It is not unusual for babies to spit up after nursing. Usually, babies can spit up and show no other signs of illness. The spitting up disappears as the baby's digestive system matures. As long as the baby has 6 to 8 wet diapers and at least 2 bowel movements in a 24 hour period (under 50 weeks of age), and your baby is gaining weight (at least 4 ounces a week) you can be assured your baby is getting enough  milk.  However, some babies have a condition called gastroesophageal reflux (GER). This happens when the muscle at the opening of the stomach opens at the wrong  times, allowing milk and food to come back up into the the tube in the throat (esophagus). Symptoms of GER can include:   Crying as if in discomfort.  Waking up frequently at night.   Problems swallowing.   Frequent red or sore throat.   Signs of asthma, bronchitis, wheezing or problems breathing.   Projectile vomiting.   Arching of the back as if in severe pain.  Slow weight gain.   Gagging or choking.   Frequent hiccupping or burping.   Severe spitting up, or spitting up after every  feeding, or hours after eating.  Refusal to eat.   Many healthy babies might have some of these symptoms and do not have GER. But there are babies who might only have a few of these symptoms and have a severe case of GER. Not all babies with GER spit up or vomit.  Some babies with GER do not have a serious medical problem. But caring for them can be hard since they tend to be very fussy and wake up frequently at night. More severe cases of GER may need to be treated with medication if the baby, in addition to spitting up, also refuses to nurse, gains weight poorly or is losing weight, or has periods of gagging or choking.   If your baby spits up after every feeding and has any of the other symptoms mentioned above, it is best to see his or her doctor for a correct diagnosis. Other than GER, your baby could have another condition that needs treatment. If there are no other signs of illness, he/she could just be sensitive to a food in your diet or a medication he/she is receiving. If your baby has GER, it is important to try to continue to breastfeed since breast milk still is more easily digested than formula. Try smaller, more frequent feedings, thorough burping, and putting the baby in an upright position during and after feedings.  CLEFT PALATE AND CLEFT LIP  Cleft palate and cleft lip are some of the most common birth defects that happen as a baby is developing in the womb. A cleft, or opening, in  either the palate or lip can happen together or separately and both can be corrected through surgery. Both conditions can prevent babies from breastfeeding because a baby cannot form a good seal around the nipple and areola with his or her mouth, or get milk out of the breast well.   Cleft palate can happen on one or both sides of a baby's mouth and be partial or complete. Right after birth, a mother whose baby has a cleft palate can try to breastfeed her baby, and she can start expressing her milk right away to keep up her supply. Even if her baby cannot latch on well to her breast, the baby can be fed breast milk by cup. In some hospitals, babies with cleft palate are fitted with a mouthpiece called an obturator. This fits into the cleft and seals it for easier feeding. The baby should be able to exclusively breastfeed after surgery.   Cleft lip can happen on one or both sides of a baby's lip. But a mother can try different breastfeeding positions and use her thumb or breast to help fill in the opening left by the lip to form a seal around the breast.  With cleft lip repair, breastfeeding may only have to be stopped for a few hours.   If your baby is born with a cleft palate or cleft lip, talk with a lactation consultant in the hospital for assistance as soon as possible. Human milk and early breastfeeding is still best for your baby's health.  TWINS OR MULTIPLES Mothers of twins or multiples might feel overwhelmed with the idea of breastfeeding more than one baby at a time. The benefits of human milk to both these mothers and babies are the same as for all mothers and babies. When breastfeeding twins, your breast milk will increase to the amount the babies will need. You will have to take in more calories and liquids when nursing twins. If the babies are premature and unable to nurse, you can pump your breasts and freeze the milk until the babies are ready to nurse. But mothers of multiples get even more  benefits from breastfeeding:  Their uterus contracts, which is helpful because it has stretched even more to hold more than one baby.   Hormones are released that relax the mother, which is helpful with the added stress of caring for more than one baby.   Eight to ten hours per week are saved because there is no need to prepare formula or bottles and the mother's milk is available right away.   Breastfeeding early and often for a mother of multiples is important to keep up her milk supply. A good latch-on for each baby is important to avoid sore nipples. Many mothers find that it is easier to nurse the babies together rather than separately, and that it gets easier as the babies get older. There are many breastfeeding holds that make it easier to nurse more than one baby at a time. If you are having multiples, talk with a lactation consultant about more ways you can successfully breastfeed your babies.  BREASTFEEDING DURING PREGNANCY While most mothers who are nursing a toddler stop breastfeeding if they find out they are pregnant, it is an individual choice to decide whether to keep breastfeeding during the pregnancy. It is not unsafe for the unborn child if you continue to breastfeed an older child during this time. But, if you are having some problems in your pregnancy such as uterine pain or bleeding, a history of preterm labor or problems gaining weight during pregnancy, your doctor may advise you to wean. Your child also may decide to wean on his or her own because pregnancy changes the amount and flavor of your milk. Some women also choose to wean at this time because they have nipple soreness caused by pregnancy hormones, are nauseous, tire more easily, or find that their growing stomachs make breastfeeding uncomfortable. You will need more calories when you breastfeed while pregnant. Your milk production usually slows down around the fourth month of pregnancy.  BREASTFEEDING AFTER BREAST SURGERY     If you have had breast surgery, including breast implants, you might be worried about whether you will be able to breastfeed. The most important things that affect whether you can produce enough milk for your baby are how your surgery was done and where your incisions are, and the reasons for your surgery. For example, women who have had incisions in the fold under the breasts are less likely to have problems producing milk than women who have had incisions around or across the areola. Incisions around the areola can cut into milk ducts and nerves, where milk is produced and  travels. Women who have had breast surgery to augment breasts that never fully developed may not have enough glands to produce a full milk supply.   If you had breast surgery and are worried about how it will affect breastfeeding, talk with a Advertising copywriter. If you are planning breast surgery and are worried about how it will affect breastfeeding, talk with your surgeon about ways he or she can preserve as much of the breast tissue and milk ducts as possible.  INDUCING LACTATION  Many mothers who adopt want to breastfeed their babies and can do it successfully with some help. It is successful 1/3 to 1/2 of the time it is tried. Many will need to supplement their breast milk with donated breast milk or infant formula. But some adoptive mothers can breastfeed exclusively, especially if they have been pregnant before. Lactation is a hormonal response to a physical action. So the stimulation of the baby nursing causes the body to see a need for and produce milk. The more the baby nurses, the more a woman's body will produce milk.   Beginning a combination of hormone treatment several months before the baby is born should help facilitate lactation in many cases.   One thing you can do to prepare is to pump every 3 hours around the clock for two to three weeks before your baby arrives. Or you can wait until the baby arrives and  starts to nurse. Breast milk can be frozen until you are ready to nurse the baby. A supplemental nursing system (SNS) or a lactation aid can help ensure that your baby gets enough nutrition and that your breasts are stimulated to produce milk at the same time.   If you are an adoptive mother who wants to breastfeed, you should see both a Advertising copywriter and your doctor for help in establishing an initial milk supply.  FOR MORE INFORMATION, CONTACT [employer] www.llli.org Document Released: 10/14/2005 Document Re-Released: 07/17/2009 [facility] Patient Information 2011 [place], [place].IMPORTANT: HOW TO USE THIS INFORMATION:  This is a summary and does NOT have all possible information about this product. This information does not assure that this product is safe, effective, or appropriate for you. This information is not individual medical advice and does not substitute for the advice of your health care professional. Always ask your health care professional for complete information about this product and your specific health needs.    LEVONORGESTREL-RELEASING IMPLANT - INTRAUTERINE ([id number]-nor-JEST-rell)    COMMON BRAND NAME(S): Mirena    USES:  This product is a small, flexible device that is placed in the womb (uterus) to prevent pregnancy. It is used in women who desire reversible birth control that works for a long time (up to 5 years). The device works by slowly releasing a hormone (levonorgestrel) that is similar to a certain substance made by a woman's body. This product is only intended for women who have previously given birth and have only one sexual partner. It is not meant for women with a history of certain infections/conditions (e.g., pelvic inflammatory disease, sexually transmitted disease, a certain problem pregnancy called ectopic pregnancy). For more information, consult your doctor. The use of this medication device does not protect you or your partner against  sexually transmitted diseases (e.g., HIV, gonorrhea). Carefully read all of the information provided by your doctor, and ask any questions you may have about this product or other birth control methods that may be right for you.    HOW TO USE:  Read the Patient Information Leaflet provided by your pharmacist before this medication device is inserted and each time it is re-inserted. The leaflet contains very important information about side effects and when it is important to call your doctor. If you have any questions, consult your doctor or pharmacist. This product is inserted into your uterus by a properly trained health care professional, usually once every 5 years or as determined by your doctor. The medication in the device is slowly released into the body over a 5-year period. Have a follow-up appointment 4-12 weeks after insertion of this product to check that it is still correctly in place. If you still desire birth control after 5 years, the medication device may be replaced with a new one. The medication device may also be removed at any time by a properly trained health care professional. Learn all the instructions on how and when to check this product and its proper positioning in your body, and make sure you understand the problems that may occur with this product. See also Precautions section.    SIDE EFFECTS:  Irregular vaginal bleeding (e.g., spotting), cramps, headache, nausea, breast pain, acne, rash, hair loss, weight gain, or decreased interest in sex may occur. If any of these effects persist or worsen, tell your doctor promptly. Remember that your doctor has prescribed this medication device because he or she has judged that the benefit to you is greater than the risk of side effects. Many people using this medication device do not have serious side effects. Tell your doctor immediately if any of these serious side effects occur: lack of menstrual period, unexplained fever, chills, trouble  breathing, mental/mood changes (e.g., depression, nervousness), vaginal swelling/itching, painful intercourse. Tell your doctor immediately if any of these unlikely but serious side effects occur: migraine/severe headache, vomiting, tiredness, fast/pounding heartbeat. Tell your doctor immediately if any of these highly unlikely but very serious side effects occur: prolonged or heavy vaginal bleeding, unusual vaginal discharge/odor, vaginal sores, abdominal/pelvic pain or tenderness, lumps in the breast, yellowing eyes/skin, dark urine, persistent nausea, trouble urinating. A very serious allergic reaction to this drug is rare. However, seek immediate medical attention if you notice any of the following symptoms of a serious allergic reaction: rash, itching/swelling (especially of the face/tongue/throat), severe dizziness, trouble breathing. This is not a complete list of possible side effects. If you notice other effects not listed above, contact your doctor or pharmacist. In the [place] - Call your doctor for medical advice about side effects. You may report side effects to FDA at 1-800-FDA-1088. In [place] - Call your doctor for medical advice about side effects. You may report side effects to [facility] [place] at [phone].    PRECAUTIONS:  Before using this medication device, tell your doctor or pharmacist if you are allergic to levonorgestrel, or to any other progestins (e.g., norethindrone, desogestrel); or if you have any other allergies. This product may contain inactive ingredients, which can cause allergic reactions or other problems. Talk to your pharmacist for more details. This medication device should not be used if you have certain medical conditions. Before using this product, consult your doctor or pharmacist if you have: current known or suspected pregnancy, previous ectopic pregnancy, uterus problems (e.g., cancer, endometriosis, fibroids, pelvic inflammatory disease-PID), other IUD (intrauterine  device) still in place, vaginal problems (e.g., infection), breast cancer, liver disease/tumors, any condition that affects your immune system (e.g., AIDS, leukemia). Before using this product, tell your doctor your medical history, especially of: bleeding problems (e.g.,  menstrual changes, clotting problems), heart problems (e.g., congenital valve conditions), high blood pressure, migraine headaches, stroke, diabetes. If you have diabetes, this medication may make it harder to control your blood sugar levels. Monitor your blood sugar regularly as directed by your doctor. Tell your doctor the results and any symptoms such as increased thirst/urination. Your anti-diabetic medication or diet may need to be adjusted. This medication device may sometimes come out by itself or move out of place. This may result in unwanted pregnancy or other problems. After each menstrual period, check to make sure it is in the right place. Talk to your doctor about how to check your device. If it comes out or you cannot feel its threads, call your doctor promptly, and use a backup birth control method such as condoms. If you or partner has any other sexual partners, this medication device may no longer be a good choice for pregnancy prevention. If you or your partner becomes HIV positive, or if you think you may have been exposed to any sexually transmitted disease, contact your doctor immediately. You should consider having this device removed. This medication device must not be used during pregnancy. If you become pregnant or think you may be pregnant, tell your doctor immediately. If you have just given birth and are not breast-feeding, or if you have had a pregnancy loss or abortion after the 3 months of pregnancy, wait at least 6 weeks (or as directed by your doctor) before using this medication device. Consult your doctor about the problems that may occur during pregnancy while using this product. Levonorgestrel passes into breast  milk. Consult your doctor before breast-feeding.    DRUG INTERACTIONS:  Your doctor or pharmacist may already be aware of any possible drug interactions and may be monitoring you for them. Do not start, stop, or change the dosage of any medicine before checking with them first. Before using this medication device, tell your doctor of all prescription and nonprescription medications you may use, especially of: "blood thinners" (e.g., warfarin), birth control taken by mouth or applied to the skin (patch), certain drug used for varicose vein treatment (sodium tetradecyl sulfate), drugs that affect your immune response (e.g., corticosteroids such as prednisone). This document does not contain all possible interactions. Therefore, before using this product, tell your doctor or pharmacist of all the products you use. Keep a list of all your medications with you, and share the list with your doctor and pharmacist.    OVERDOSE:  Overdose with this medication is very unlikely because of the way the drug is released from this device. Consult your doctor or pharmacist for more information.    NOTES:  Do not share this medication with others. Keep all appointments with your doctor and the laboratory. You should have regular complete physical exams including blood pressure, breast exam, pelvic exam, and screening for cervical cancer (Pap smear). Follow your doctor's instructions for examining your own breasts, and report any lumps immediately. Consult your doctor for more details.    MISSED DOSE:  Not applicable.    STORAGE:  Before use, store at room temperature at 77 degrees F (25 degrees C) away from light and moisture. Brief storage between 59-86 degrees F (15-30 degrees C) is permitted. Keep all medications and medical devices away from children and pets. Do not flush medications down the toilet or pour them into a drain unless instructed to do so. Properly discard this product when it is expired or no longer  needed. Consult your  pharmacist or Insurance risk surveyor company for more details about how to safely discard your product.    Information last revised June 2010 Copyright(c) 2010 [employer].

## 2010-11-15 NOTE — Progress Notes (Signed)
  Subjective:     Jennifer Patterson is a 30 y.o. female who presents for a postpartum visit. She is 6 week postpartum following a spontaneous vaginal delivery. I have fully reviewed the prenatal and intrapartum course. The delivery was at term gestational weeks. Outcome: spontaneous vaginal delivery. Anesthesia: epidural. Postpartum course has been routine. Baby's course has been normal. Baby is feeding by breast. Bleeding no bleeding. Bowel function is normal. Bladder function is normal. Patient is not sexually active. Contraception method is desires Mirena IUD. Postpartum depression screening: negative, Edinburgh Score 7.  The following portions of the patient's history were reviewed and updated as appropriate: allergies, current medications, past family history, past medical history, past social history, past surgical history and problem list.  Review of Systems A comprehensive review of systems was negative.   Objective:    BP 109/71  Pulse 56  Temp(Src) 98.8 F (37.1 C) (Oral)  Ht 5\' 7"  (1.702 m)  Wt 200 lb 4.8 oz (90.855 kg)  BMI 31.37 kg/m2  Breastfeeding? Yes  General:  alert, cooperative, appears stated age and no distress   Breasts:  inspection negative, no nipple discharge or bleeding, no masses or nodularity palpable        Abdomen: soft, non-tender; bowel sounds normal; no masses,  no organomegaly   Vulva:  not evaluated  Vagina: not evaluated                    Assessment:    6 week postpartum exam. Pap smear not done at today's visit.   Plan:    1. Contraception: IUD 2. Breastfeeding without complications 3. Follow up in:  as needed. for Mirena IUD instert.

## 2010-11-30 ENCOUNTER — Ambulatory Visit: Payer: 59 | Admitting: Internal Medicine

## 2010-12-10 ENCOUNTER — Encounter: Payer: Self-pay | Admitting: Internal Medicine

## 2010-12-27 ENCOUNTER — Ambulatory Visit: Payer: 59 | Admitting: Physician Assistant

## 2011-09-17 ENCOUNTER — Encounter: Payer: Self-pay | Admitting: Medical

## 2011-09-17 ENCOUNTER — Other Ambulatory Visit (HOSPITAL_COMMUNITY)
Admission: RE | Admit: 2011-09-17 | Discharge: 2011-09-17 | Disposition: A | Discharge status: Home / Self Care | Payer: 59 | Source: Ambulatory Visit | Attending: Obstetrics and Gynecology | Admitting: Obstetrics and Gynecology

## 2011-09-17 ENCOUNTER — Ambulatory Visit (INDEPENDENT_AMBULATORY_CARE_PROVIDER_SITE_OTHER): Payer: 59 | Admitting: Medical

## 2011-09-17 VITALS — BP 112/80 | HR 60 | Temp 98.2°F | Resp 16 | Ht 67.5 in | Wt 206.0 lb

## 2011-09-17 DIAGNOSIS — E669 Obesity, unspecified: Secondary | ICD-10-CM

## 2011-09-17 DIAGNOSIS — Z01419 Encounter for gynecological examination (general) (routine) without abnormal findings: Secondary | ICD-10-CM | POA: Insufficient documentation

## 2011-09-17 DIAGNOSIS — Z124 Encounter for screening for malignant neoplasm of cervix: Secondary | ICD-10-CM

## 2011-09-17 DIAGNOSIS — Z309 Encounter for contraceptive management, unspecified: Secondary | ICD-10-CM

## 2011-09-17 DIAGNOSIS — K029 Dental caries, unspecified: Secondary | ICD-10-CM

## 2011-09-17 DIAGNOSIS — Z0000 Encounter for general adult medical examination without abnormal findings: Secondary | ICD-10-CM

## 2011-09-17 DIAGNOSIS — B351 Tinea unguium: Secondary | ICD-10-CM

## 2011-09-17 LAB — POCT URINALYSIS DIPSTICK
Spec Grav, UA: 1.015
Urobilinogen, UA: NEGATIVE
pH, UA: 6

## 2011-09-17 LAB — POCT URINE PREGNANCY: Preg Test, Ur: NEGATIVE

## 2011-09-17 MED ORDER — NORGESTIM-ETH ESTRAD TRIPHASIC 0.18/0.215/0.25 MG-25 MCG PO TABS: 1.0000 | ORAL_TABLET | Freq: Every day | ORAL | Status: DC

## 2011-09-17 MED ORDER — TERBINAFINE HCL 250 MG PO TABS: 250.0000 mg | ORAL_TABLET | Freq: Every day | ORAL | Status: DC

## 2011-09-17 NOTE — Patient Instructions (Signed)

## 2011-09-17 NOTE — Progress Notes (Signed)
Subjective:   HPI  Jennifer Patterson is a 31 y.o. female who presents for a complete physical.  Here as a new patient today.    Last medical care about a year ago with the delivery of her last child.   She would like to try different OCP due to breakthrough bleeding with Trinessa.  Was on Lo-Loestrin prior.  Hx/o Mirena, led to PID.  In the past has tried ortho tri cyclen, did on ok on this.  Refuses Depo Provera.    She has concerns of toenail fungus on right great toe.  Preventative care: Last dental visit:>1 year ago Last vaccines from [place]. Last pap smear 2012.  Reviewed their medical, surgical, family, social, medication, and allergy history and updated chart as appropriate.   Past Medical History  Diagnosis Date  . Bipolar disorder 2003    hospitalized 2003  . UTI (urinary tract infection)     frequent during pregnancy  . Chlamydia   . PID (pelvic inflammatory disease)   . Anemia     during pregnancy  . Allergy   . Ovarian cyst     history of  . Contraception management     6 pregnancies, 1 SAB, 3 TAB, 2 live births; failed [place]  and Depo Provera prior due to adverse effects     Past Surgical History  Procedure Date  . Dilation and curettage of uterus   . Hernia repair   . Tonsillectomy   . Appendectomy 2002    Family History  Problem Relation Age of Onset  . Depression    . Hypertension Paternal Grandmother   . Diabetes Paternal Grandmother   . Cancer Mother 30    cervical  . Hypertension Mother   . Thyroid disease Mother     History   Social History  . Marital Status: Single    Spouse Name: N/A    Number of Children: N/A  . Years of Education: N/A   Occupational History  . prior authorizations for [employer]   Social History Main Topics  . Smoking status: Former Smoker    Quit date: 05/06/2005  . Smokeless tobacco: Never Used  . Alcohol Use: No  . Drug Use: No  . Sexually Active: Not Currently    Birth Control/  Protection: None   Other Topics Concern  . Not on file   Social History Narrative   2 biological children and 1 step child, lives with her partner, exercise with walking    Current Outpatient Prescriptions on File Prior to Visit  Medication Sig Dispense Refill  . Norgestimate-Ethinyl Estradiol Triphasic (TRINESSA, 28,) 0.18/0.215/0.25 MG-35 MCG tablet Take 1 tablet by mouth daily.        No Known Allergies  Review of Systems Constitutional: -fever, -chills, -sweats, -unexpected weight change, -anorexia, -fatigue Allergy: +sneezing, +itching, -congestion Dermatology: denies changing moles, rash, lumps, new worrisome lesions ENT: -runny nose, -ear pain, -sore throat, -hoarseness, -sinus pain, -teeth pain, -tinnitus, -hearing loss, -epistaxis Cardiology:  -chest pain, -palpitations, -edema, -orthopnea, -paroxysmal nocturnal dyspnea Respiratory: -cough, -shortness of breath, -dyspnea on exertion, -wheezing, -hemoptysis Gastroenterology: -abdominal pain, -nausea, -vomiting, -diarrhea, -constipation, -blood in stool, -changes in bowel movement, -dysphagia Hematology: +bleeding or bruising problems Musculoskeletal: -arthralgias, -myalgias, -joint swelling, -back pain, -neck pain, -cramping, -gait changes Ophthalmology: -vision changes, -eye redness, -itching, -discharge Urology: -dysuria, -difficulty urinating, -hematuria, -urinary frequency, -urgency, incontinence Neurology: -headache, -weakness, -tingling, -numbness, -speech abnormality, -memory loss, -falls, -dizziness Psychology:  -depressed mood, -agitation, -sleep problems  Objective:   Physical Exam  Filed Vitals:   09/17/11 1526  BP: 112/80  Pulse: 60  Temp: 98.2 F (36.8 C)  Resp: 16    General appearance: alert, no distress, WD/WN, overweight female Skin: no worrisome lesions, right great toenail with greenish coloration HEENT: normocephalic, conjunctiva/corneas normal, sclerae anicteric, PERRLA, EOMi, nares patent,  no discharge or erythema, pharynx normal Oral cavity: MMM, tongue normal, teeth - left upper incisor with obvious decay, right upper molar with decay Neck: supple, no lymphadenopathy, no thyromegaly, no masses, normal ROM Chest: non tender, normal shape and expansion Heart: RRR, normal S1, S2, no murmurs Lungs: CTA bilaterally, no wheezes, rhonchi, or rales Abdomen: +bs, soft, non tender, non distended, no masses, no hepatomegaly, no splenomegaly, no bruits Back: non tender, normal ROM, no scoliosis Musculoskeletal: upper extremities non tender, no obvious deformity, normal ROM throughout, lower extremities non tender, no obvious deformity, normal ROM throughout Extremities: no edema, no cyanosis, no clubbing Pulses: 2+ symmetric, upper and lower extremities, normal cap refill Neurological: alert, oriented x 3, CN2-12 intact, strength normal upper extremities and lower extremities, sensation normal throughout, DTRs 2+ throughout, no cerebellar signs, gait normal Psychiatric: normal affect, behavior normal, pleasant  Breast: nontender, no masses or lumps, no skin changes, no nipple discharge or inversion, no axillary lymphadenopathy Gyn: Normal external genitalia without lesions, vagina with normal mucosa, cervix without lesions, no cervical motion tenderness, no abnormal vaginal discharge.  Uterus and adnexa not enlarged, nontender, no masses.  Pap performed.  Exam chaperoned by nurse.  Rectal: deferred    Assessment and Plan :    Encounter Diagnoses  Name Primary?  . Routine general medical examination at a health care facility Yes  . Screening for cervical cancer   . Contraception management   . Dental caries   . Obesity   . Onychomycosis      Physical exam - discussed healthy lifestyle, diet, exercise, preventative care, vaccinations, and addressed their concerns.  Handout given.  She had labs in February.  She will get me copies of this.   Pap smear today.  Contraception  management - urine pregnancy negative.  Stop Imelda Pillow and begin Ortho Tri Cyclen Lo.  She was having breakthrough bleeding with Trinessa.    Dental caries - recommended dental f/u ASAP.  Gave contact info to local dentist.  Obesity - discussed lifestyle changes, recommended weight loss  onychomycosis - begin Lamisil, discusses risks/benefits, recheck  6wk.  Follow-up pending labs.

## 2011-09-18 ENCOUNTER — Telehealth: Payer: Self-pay | Admitting: Medical

## 2011-09-18 NOTE — Telephone Encounter (Signed)
pls call pharmacy and get there help. I'm not going to go back and forth on this.    Pt is not doing well on Trinessa, having breakthrough bleeding.   She has done well on Loloestrin but this is too expensive.  I sent Ortho tricyclen Lo which I thought would have been cheap, but apparently not.  Pls ask pharmacist for suggestions that will be affordable.

## 2011-09-20 ENCOUNTER — Other Ambulatory Visit: Payer: Self-pay | Admitting: Medical

## 2011-09-20 MED ORDER — NORETHIN-ETH ESTRADIOL-FE 0.8-25 MG-MCG PO CHEW: 1.0000 | CHEWABLE_TABLET | Freq: Every day | ORAL | Status: DC

## 2011-09-20 NOTE — Telephone Encounter (Signed)
i sent birth control to pharmacy.  This one per the pharmacist should be much cheaper.   If having problems with this let me know.

## 2011-09-20 NOTE — Telephone Encounter (Signed)
I CALLED THE PHARMACY AND THEY SAID SHE CAN TRY GENERESS FE OR LOLESTRIN FE 24. SHE THERE ARE COUPONS ONLINE AND IT WILL TAKE THE COST DOWN TO 25.00 A MONTH. CLS

## 2011-09-20 NOTE — Telephone Encounter (Signed)
PATIENT WAS NOTIFIED THAT HER RX WAS SENT TO THE PHARMACY. CLS

## 2011-10-11 IMAGING — US US FETAL BPP W/O NONSTRESS
1 series · 14 of 17 positions shown · non-contrast
Comparison: none

[Series 1: us fetal bpp w/o nonstress · 17 acquisitions, 14 frames shown]
[im 1/17]
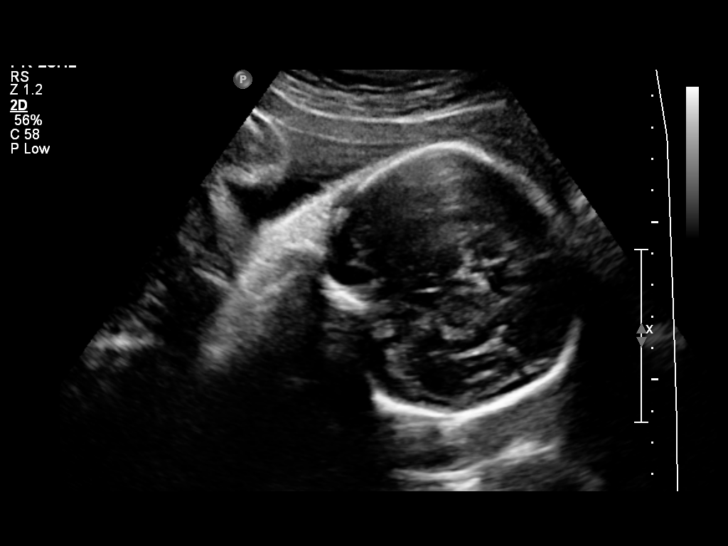
[im 2/17]
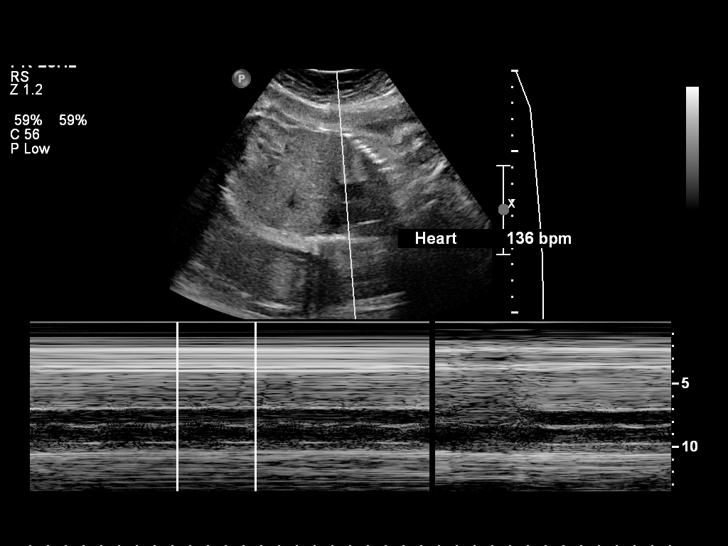
[im 4/17]
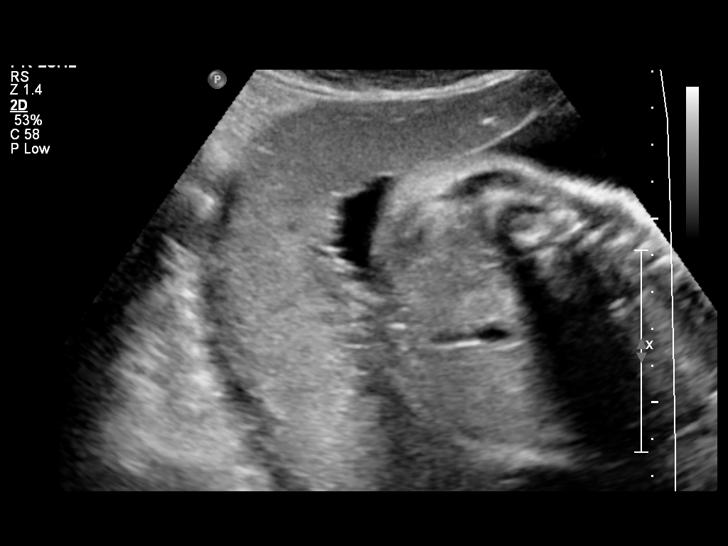
[im 5/17]
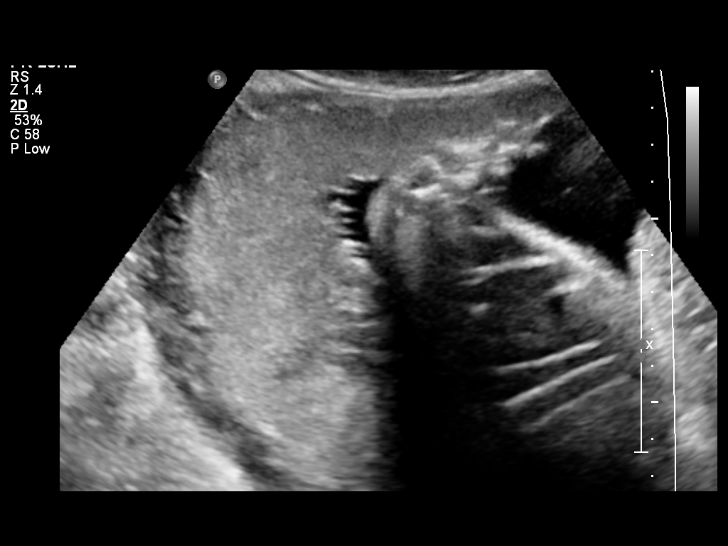
[im 6/17]
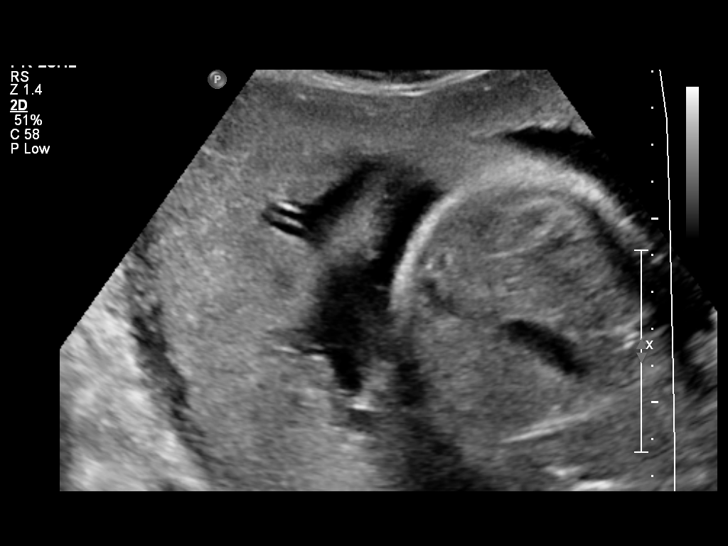
[im 7/17]
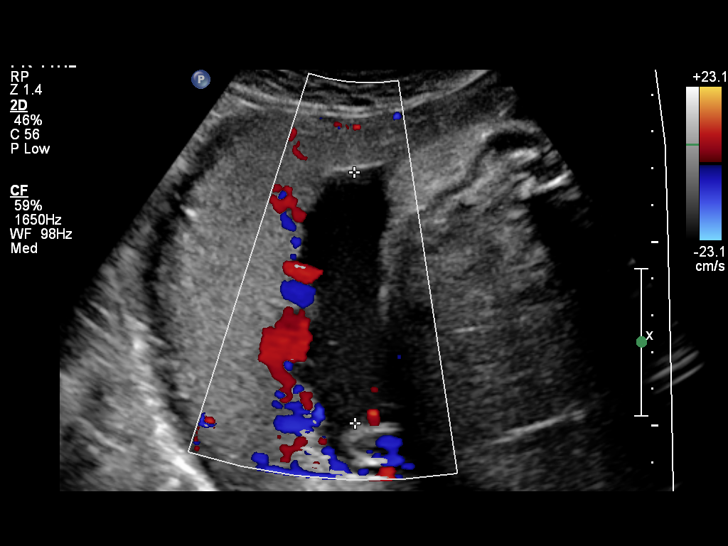
[im 8/17]
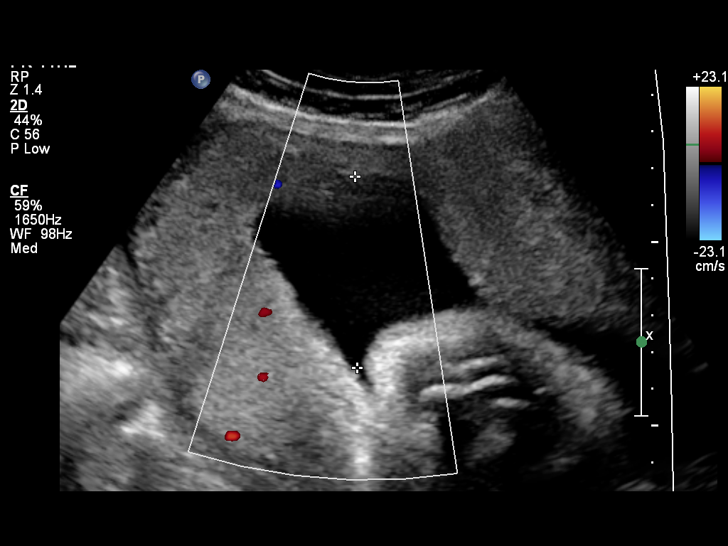
[im 10/17]
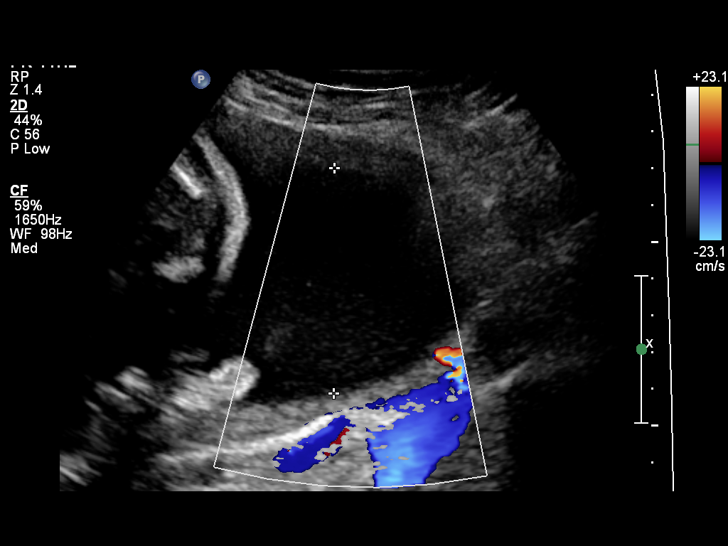
[im 11/17]
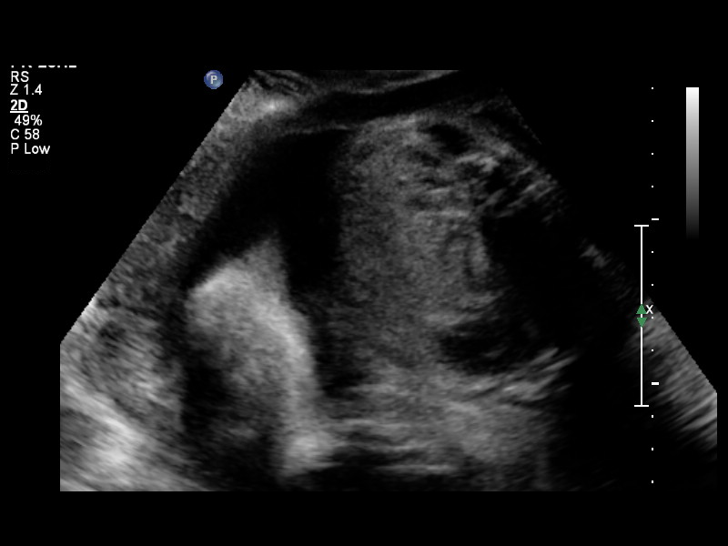
[im 12/17]
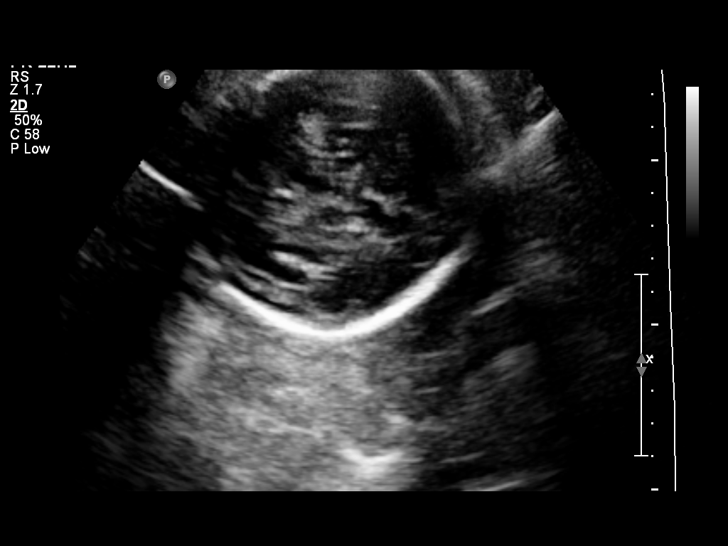
[im 13/17]
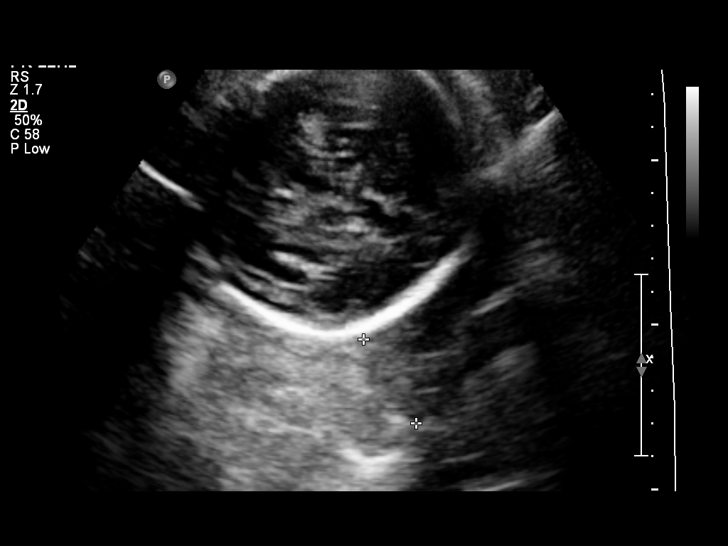
[im 14/17]
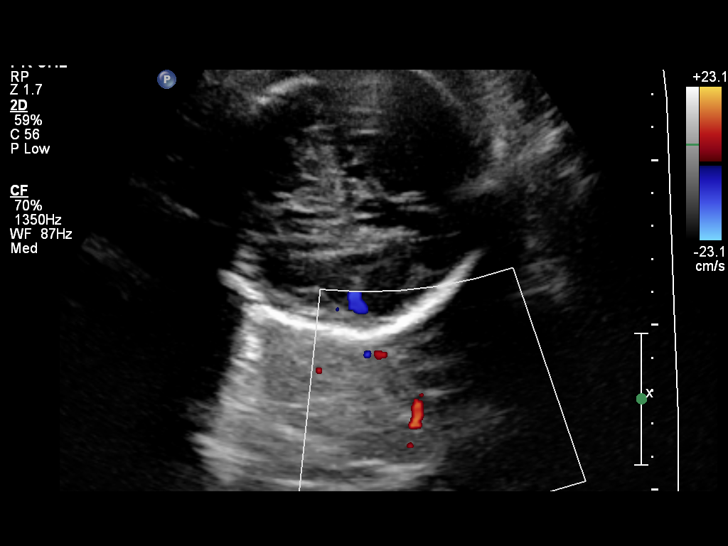
[im 16/17]
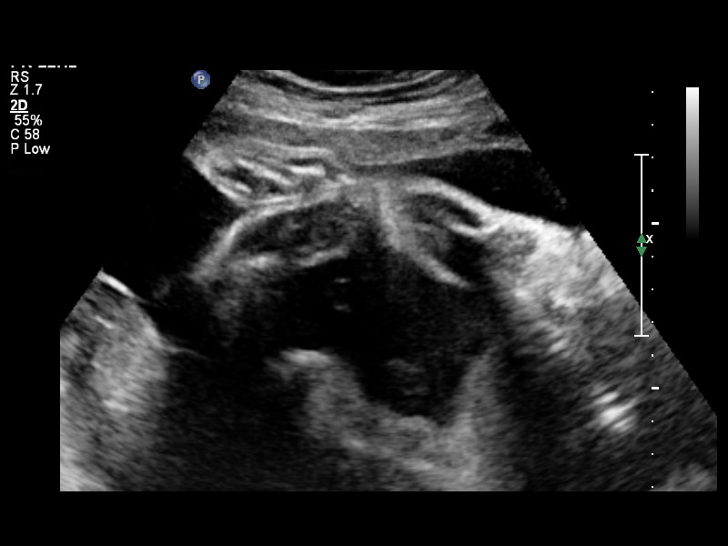
[im 17/17]
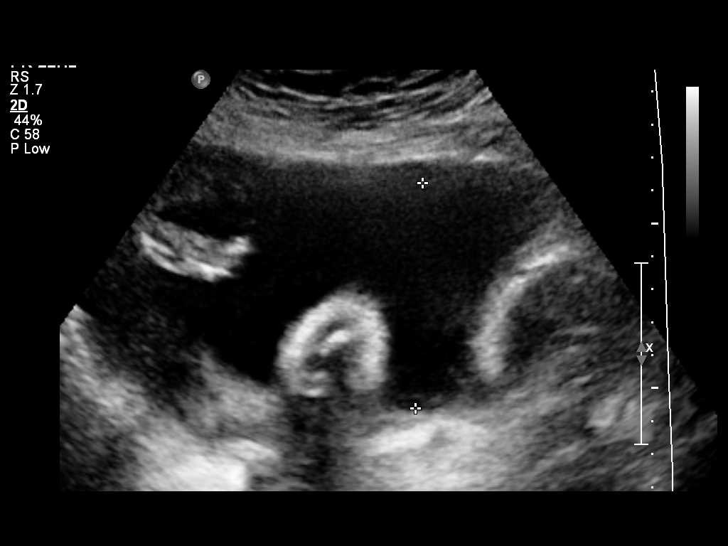

[14 of 17 positions shown; findings below may reference images not displayed]

Canned report from images found in remote index.

Refer to host system for actual result text.

## 2011-10-21 ENCOUNTER — Other Ambulatory Visit: Payer: Self-pay | Admitting: Medical

## 2011-10-21 ENCOUNTER — Telehealth: Payer: Self-pay | Admitting: Medical

## 2011-10-21 MED ORDER — NORETHIN-ETH ESTRADIOL-FE 0.8-25 MG-MCG PO CHEW: 1.0000 | CHEWABLE_TABLET | Freq: Every day | ORAL | Status: DC

## 2011-10-21 NOTE — Telephone Encounter (Signed)
i refilled the last think we sent.  Pls call pharmacy and verify this is the same medication as the one she recently picked up.

## 2011-10-23 ENCOUNTER — Telehealth: Payer: Self-pay | Admitting: Family Medicine

## 2011-10-23 NOTE — Telephone Encounter (Signed)
I CALLED THE PHARMACY ABOUT THE PATIENTS REFILLS ON HER BRAND NAME BIRTH CONTROL MEDICATION AND THE PHARMACY SAID THAT SHE HAS 12 REFILLS ON THE ORTHO TRI CYCLEN. I CALLED AND LMOM FOR THE PATIENT LETTING HER KNOW THIS. CLS

## 2011-10-23 NOTE — Telephone Encounter (Signed)
PT INFORMED-LM 

## 2011-11-19 ENCOUNTER — Other Ambulatory Visit: Payer: Self-pay | Admitting: Medical

## 2011-11-20 NOTE — Telephone Encounter (Signed)
RX refill on Valium.

## 2011-11-20 NOTE — Telephone Encounter (Signed)
Why am i getting this refill?  i wasn't aware of patient being on this?

## 2011-12-16 ENCOUNTER — Encounter: Payer: Self-pay | Admitting: Medical

## 2011-12-16 ENCOUNTER — Ambulatory Visit (INDEPENDENT_AMBULATORY_CARE_PROVIDER_SITE_OTHER): Payer: 59 | Admitting: Medical

## 2011-12-16 VITALS — BP 112/80 | HR 100 | Temp 98.4°F | Resp 18 | Wt 195.0 lb

## 2011-12-16 DIAGNOSIS — K029 Dental caries, unspecified: Secondary | ICD-10-CM

## 2011-12-16 DIAGNOSIS — B351 Tinea unguium: Secondary | ICD-10-CM

## 2011-12-16 DIAGNOSIS — Z3201 Encounter for pregnancy test, result positive: Secondary | ICD-10-CM

## 2011-12-16 NOTE — Addendum Note (Signed)
Addended by: Janeice Robinson on: 12/16/2011 01:25 PM   Modules accepted: Orders

## 2011-12-16 NOTE — Progress Notes (Signed)
Subjective: Here for possible pregnancy.  She saw me back in May, and at that time her Ortho Tri Cyclen Lo was too expensive.  So she ended up starting Ortho Tri Cyclen plain.  She had been taking this the same time of day every day, but last month she missed 2 days when she was between packs.  She denies missing any other days.  She notes for the last 2 weeks been feeling odd, having morning nausea, and ended up taking a home pregnancy test which was positive.  LMP around 7/11 or 11/15/2011.  She had started back on OCPs for irregular periods. The Ortho tri cyclen was helping this.  otherwise has been in her usual state of health.  She stopped OCP last week when she called in for the appt.  She also stopped Lamisil which was helping her toenail fungus.  She notes that if +, she wants to keep the child.  She has a $2500 deductible she must meet before the insurance kicks in OB care.   She is worried as she does not have $2500.  She has not told husband about the + test yet.  No other aggravating or relieving factors.   She notes that her son was convenience similarly while on OCPs.    Past Medical History  Diagnosis Date  . Bipolar disorder 2003    hospitalized 2003  . UTI (urinary tract infection)     frequent during pregnancy  . Chlamydia   . PID (pelvic inflammatory disease)   . Anemia     during pregnancy  . Allergy   . Ovarian cyst     history of  . Contraception management     6 pregnancies, 1 SAB, 3 TAB, 2 live births; failed [place]  and Depo Provera prior due to adverse effects    Objective: Gen: wd, wn, nad Heart: RRR, normal S1, S2 Lungs: CTA Abdomen: +bs, soft, nontender, no mass, no organomegaly  Assessment: Encounter Diagnoses  Name Primary?  . Pregnancy test positive Yes  . Onychomycosis   . Dental caries     Urine pregnancy + today.  Will send beta HCG lab.  Discussed OB care, prenatal care.  She has begun prenatal vitamin.   Once we get beta HCG back, she will let me  know where she wants to be referred for OB care.   Onychomycosis - stop Lamisil for now, but check with obstetrician about whether they want her to c/t Lamisil or not.  Dental caries - finish amoxicillin for dental caries, f/u with dentist as planned.

## 2011-12-19 ENCOUNTER — Other Ambulatory Visit: Payer: Self-pay | Admitting: Medical

## 2011-12-19 ENCOUNTER — Encounter: Payer: Self-pay | Admitting: Medical

## 2011-12-19 ENCOUNTER — Telehealth: Payer: Self-pay | Admitting: Family Medicine

## 2011-12-19 ENCOUNTER — Encounter: Payer: Self-pay | Admitting: Family Medicine

## 2011-12-19 NOTE — Telephone Encounter (Signed)
Fax letter. Letter ready

## 2011-12-19 NOTE — Telephone Encounter (Signed)
Patient needs a letter with your signature on it stating that she is pregnant and her expexted due date so she can take this down to the [facility] office. The fax number to fax this letter over to [phone]. CLS

## 2011-12-19 NOTE — Telephone Encounter (Signed)
I FAX OVER THE LETTER TO THE FAX NUMBER THAT WAS PROVIDED FROM THE PATIENT. cls

## 2011-12-19 NOTE — Telephone Encounter (Signed)
I FAX OVER THE LETTER TO THE FAX NUMBER THAT THE PATIENT PROVIDED. CLS

## 2012-01-01 ENCOUNTER — Other Ambulatory Visit: Payer: Self-pay

## 2012-01-07 ENCOUNTER — Ambulatory Visit (HOSPITAL_COMMUNITY)
Admission: RE | Admit: 2012-01-07 | Discharge: 2012-01-07 | Disposition: A | Discharge status: Home / Self Care | Payer: 59 | Source: Ambulatory Visit | Attending: Family Medicine | Admitting: Family Medicine

## 2012-01-07 ENCOUNTER — Ambulatory Visit (HOSPITAL_COMMUNITY)
Admission: RE | Admit: 2012-01-07 | Discharge: 2012-01-07 | Disposition: A | Discharge status: Home / Self Care | Payer: 59 | Source: Ambulatory Visit | Attending: Obstetrics and Gynecology | Admitting: Obstetrics and Gynecology

## 2012-01-07 ENCOUNTER — Encounter (HOSPITAL_COMMUNITY): Payer: Self-pay

## 2012-01-08 ENCOUNTER — Encounter (HOSPITAL_COMMUNITY): Payer: Self-pay | Admitting: *Deleted

## 2012-01-08 ENCOUNTER — Emergency Department (HOSPITAL_COMMUNITY)
Admission: EM | Admit: 2012-01-08 | Discharge: 2012-01-08 | Disposition: A | Discharge status: Home / Self Care | Payer: 59 | Attending: Emergency Medicine | Admitting: Emergency Medicine

## 2012-01-08 DIAGNOSIS — Z87891 Personal history of nicotine dependence: Secondary | ICD-10-CM | POA: Insufficient documentation

## 2012-01-08 DIAGNOSIS — K029 Dental caries, unspecified: Secondary | ICD-10-CM | POA: Insufficient documentation

## 2012-01-08 MED ORDER — PENICILLIN V POTASSIUM 500 MG PO TABS: 500.0000 mg | ORAL_TABLET | Freq: Three times a day (TID) | ORAL | Status: AC

## 2012-01-08 MED ORDER — HYDROCODONE-ACETAMINOPHEN 5-325 MG PO TABS
1.0000 | ORAL_TABLET | Freq: Once | ORAL | Status: AC
  Administered 2012-01-08: 1 via ORAL
  Filled 2012-01-08: qty 1

## 2012-01-08 MED ORDER — HYDROCODONE-ACETAMINOPHEN 5-325 MG PO TABS: 1.0000 | ORAL_TABLET | Freq: Once | ORAL | Status: AC

## 2012-01-08 MED ORDER — PENICILLIN V POTASSIUM 250 MG PO TABS
500.0000 mg | ORAL_TABLET | Freq: Four times a day (QID) | ORAL | Status: DC
  Administered 2012-01-08: 500 mg via ORAL
  Filled 2012-01-08: qty 2

## 2012-01-08 NOTE — ED Provider Notes (Signed)
History   This chart was scribed for Jennifer Kras, MD by Charolett Bumpers . The patient was seen in room TR02C/TR02C. Patient's care was started at 1930.    CSN: [id number]  Arrival date & time 01/08/12  1811   None     Chief Complaint  Patient presents with  . Dental Pain    (Consider location/radiation/quality/duration/timing/severity/associated sxs/prior treatment) HPI Jennifer Patterson is a 31 y.o. female who presents to the Emergency Department complaining of constant, moderate, rright upper dental pain for the past 5 days. Pt states that she has taken Tylenol with no relief. Pt states that she will be [redacted] weeks pregnant tomorrow. Pt is followed by a dentist and OB/GYN who she has contacted about the dental pain. Pt states that her OB won't release her to have an x-ray until she is 12 weeks. Pt states that her dentist will not extract the tooth without an x-ray. Pt denies any other complaints of pain or injuries at this time.   Past Medical History  Diagnosis Date  . UTI (urinary tract infection)     frequent during pregnancy  . Chlamydia   . PID (pelvic inflammatory disease)   . Anemia     during pregnancy  . Allergy   . Ovarian cyst     history of  . Contraception management     6 pregnancies, 1 SAB, 3 TAB, 2 live births; failed [place]  and Depo Provera prior due to adverse effects    Past Surgical History  Procedure Date  . Dilation and curettage of uterus   . Tonsillectomy   . Appendectomy 2002  . Hernia repair     umbilical    Family History  Problem Relation Age of Onset  . Depression    . Diabetes      paternal side  . Hypertension Paternal Grandmother   . Diabetes Paternal Grandmother   . Cancer Mother 30    cervical  . Hypertension Mother   . Thyroid disease Mother   . Stroke Maternal Aunt   . Cancer Paternal Grandfather     colon  . Heart disease Neg Hx     History  Substance Use Topics  . Smoking status: Former Smoker    Quit date:  05/06/2005  . Smokeless tobacco: Never Used  . Alcohol Use: No    OB History    Grav Para Term Preterm Abortions TAB SAB Ect Mult Living   7 3 1 2 3 2 1   2       Review of Systems  Constitutional: Negative for fever and chills.  HENT: Positive for dental problem.   Respiratory: Negative for shortness of breath.   Gastrointestinal: Negative for nausea and vomiting.  Neurological: Negative for weakness.  All other systems reviewed and are negative.    Allergies  Review of patient's allergies indicates no known allergies.  Home Medications   Current Outpatient Rx  Name Route Sig Dispense Refill  . ACETAMINOPHEN 325 MG PO TABS Oral Take 650 mg by mouth every 6 (six) hours as needed. For pain    . PRENATAL 27-0.8 MG PO TABS Oral Take 1 tablet by mouth daily.      BP 126/74  Pulse 75  Temp 99.1 F (37.3 C) (Oral)  Resp 18  SpO2 98%  LMP 11/14/2011  Physical Exam  Nursing note and vitals reviewed. Constitutional: She appears well-developed and well-nourished. No distress.  HENT:  Head: Normocephalic and atraumatic.  Right Ear:  External ear normal.  Left Ear: External ear normal.       Dental caries on multiple teeth. Right superior posterior molar with cavity and defect of the enamel.   Eyes: Conjunctivae are normal. Right eye exhibits no discharge. Left eye exhibits no discharge. No scleral icterus.  Neck: Neck supple. No tracheal deviation present.  Cardiovascular: Normal rate.   Pulmonary/Chest: Effort normal. No stridor. No respiratory distress.  Musculoskeletal: She exhibits no edema.  Neurological: She is alert. Cranial nerve deficit: no gross deficits.  Skin: Skin is warm and dry. No rash noted.  Psychiatric: She has a normal mood and affect.    ED Course  Procedures (including critical care time)  DIAGNOSTIC STUDIES: Oxygen Saturation is 98% on room air, normal by my interpretation.    COORDINATION OF CARE:  19:36-Discussed planned course of  treatment with the patient including pain medication and referral to the dentist on call, who is agreeable at this time.   19:45-Medication Orders: Penicillin v potassium (Veetid) tablet 500 mg-4 times/daily; Hydrocodone-acetaminophen (Norco/Vicodin) 5-325 mg per tablet 1 tablet-once  Labs Reviewed - No data to display No results found.    MDM  I explained to the patient that hydrocodone pain medication is a category C. medication. This does provide some risk to the fetus at this stage of pregnancy. However, the patient has been taking Tylenol without relief and she states she cannot take it anymore. Patient is in the unfortunate situation of not being able to receive treatment from her dentist because the dentist is requiring a note from her OB doctor about the ability to get an x-ray.  It does not seem reasonable to me to make the patient wait for 4 weeks concerning the discomfort that she is in. I will give her a referral to another dentist.  The patient will be given prescriptions for antibiotics in case there is a component of infection that may respond to antibiotics. I have also given her prescription for hydrocodone and again explained that this medication does carry some rest during pregnancy   I personally performed the services described in this documentation, which was scribed in my presence.  The recorded information has been reviewed and considered.     Jennifer Kras, MD 01/08/12 [phone]

## 2012-01-08 NOTE — ED Notes (Signed)
Pt reports right upper dental pain since Friday. Pt is [redacted] weeks pregnant.

## 2012-01-08 NOTE — ED Notes (Signed)
Patient currently taking tylenol with no relief.  Airway intact bilateral equal chest rise and fall.

## 2012-01-10 NOTE — Progress Notes (Signed)
Genetic Counseling  High-Risk Gestation Note  Appointment Date:  01/10/2012 Referred By: Philip Aspen, DO Date of Birth:  09-Sep-1980    Pregnancy History: [id number] Estimated Date of Delivery: 08/20/12 Estimated Gestational Age: [redacted]w[redacted]d Attending: Particia Nearing, MD   Jennifer Patterson was seen for genetic counseling regarding a previously pregnancy with Down syndrome.  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 The patient reported that pregnancy was terminated. She reported that she lived in [place] during this pregnancy. We do not currently have records of the chromosome analysis.   Approximately 95% of individuals with Down syndrome have three freestanding copies of chromosome 21, called Trisomy 71, due to nondisjunction.  Rarely, Down syndrome is caused by a rearrangement of the genetic instructions (translocation), where the extra chromosome 21 is attached to another chromosome.  This type of chromosome rearrangement can be passed down through families or occur sporadically, and therefore may increase the chance for Down syndrome in other family members. She was counseled regarding maternal age and the association with risk for chromosome conditions due to nondisjunction with aging of the ova.   We reviewed chromosomes and nondisjunction.  We specifically discussed Down syndrome (trisomy 21) and examples of other trisomy conditions including trisomies 19 and 16, and sex chromosome aneuploidies (47,XXX and 47,XXY).   Once a couple has had one pregnancy with Trisomy 20 (assuming this was the diagnosis in the previous pregnancy), the risk of any extra chromosome condition (including Trisomy 21) in a future pregnancy is the greatest of the following figures: either  1% or a woman's age-related risk for a chromosome condition.  Since 1% is greater than her age-related risk at 31 years old, then the chance for any chromosome condition in the current pregnancy is approximately 1%.  Any pregnancy until she is 31 years-old has a 1% chance of a chromosome condition.  Any pregnancy delivered after she turns 31 years-old would have her age-related risk for a chromosome condition, which varies depending on how old she will be at delivery.    We reviewed available screening options including noninvasive prenatal testing (NIPT), First trimester screen, and detailed ultrasound.  Specifically, we discussed that NIPT analyzes cell free fetal DNA found in the maternal circulation. This test can be performed starting at [redacted] weeks gestation. This test is not diagnostic for chromosome conditions, but can provide information regarding the presence or absence of extra fetal DNA for chromosomes 13, 18, 21, X, and Y, and missing fetal DNA for chromosome X and Y (Turner syndrome). Thus, it would not identify or rule out all genetic conditions. The reported detection rate is greater than 99% for Trisomy 21, greater than 98% for Trisomy 18, and is approximately 80% (8 out of 10) for Trisomy 13. The false positive rate is reported to be less than 0.1% for any of these conditions. In addition, we discussed that ~50-80% of fetuses with Down syndrome and up to 90-95% of fetuses with trisomy 18/13, when well visualized, have detectable anomalies or soft markers by detailed ultrasound (~18+ weeks gestation).   Jennifer Patterson was also counseled regarding diagnostic testing options of CVS and amniocentesis.  We reviewed that CVS is performed typically between 10 and [redacted] weeks gestation, and that amniocentesis is typically performed after [redacted] weeks gestation. We  reviewed the approximate 1 in 100 risk for complications with CVS and approximate 1 in 300-500 risk for complications with amniocentesis,  including spontaneous pregnancy loss. After consideration of all the options, and a clear understanding of the newness and limitations of NIPT, she elected to proceed with cell free fetal DNA testing ([facility]) at approximately [redacted] weeks gestation. This was scheduled for 01/23/12. She understands that the turnaround time is approximately 8-10 days. Jennifer Patterson stated that she may consider CVS pending the results of NIPT. She understands that in the case of low fetal fraction in maternal sample, NIPT would be non-reportable, and a second sample could be submitted. Jennifer Patterson stated that in the case of a non-reportable result due to low fetal fraction, she would likely pursue CVS rather than a second attempt at NIPT.     Additionally, Jennifer Patterson reported a history of a pre-term delivery ([redacted] weeks gestation) for her first pregnancy. Her daughter is currently 38 years old and healthy. Her second pregnancy resulted in stillbirth at [redacted] weeks gestation. Per patient's report, autopsy and follow-up relevant blood work was negative. 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 We planned to further investigate the specific lab finding and current medical literature regarding recommended management. Patient reported that the management plan for her current pregnancy, per previous discussion with her OB is begin 17-P injections at 16 weeks and antenatal testing in the third trimester.   Additionally, Jennifer Patterson reported her paternal half-sister had sickle cell anemia. Jennifer Patterson reported that she previously had sickle cell screening, which was normal. We do not currently have records of this testing. We discussed sickle cell anemia (SCA) including the carrier frequency and incidence in the African-American population, the availability of carrier testing and prenatal diagnosis if indicated.  In addition, we discussed that hemoglobinopathies  are routinely screened for as part of the [facility] newborn screening panel.  If Jennifer Patterson does not carry sickle cell trait, or other hemoglobin variant, then the pregnancy would not be expected to be at risk for sickle cell disease.   Jennifer Patterson also reported a female paternal first cousin once removed with developmental delay, attributed to preterm delivery at [redacted] weeks gestation. We discussed that there are many different causes of mental delays such as genetic differences, sporadic causes, or injuries.  In the case that his is due to preterm delivery, this family history would not be expected to increase recurrence risk for relatives.  Without further information regarding the provided family history, an accurate genetic risk cannot be calculated. Further genetic counseling is warranted if more information is obtained.  Jennifer Patterson denied exposure to environmental toxins or chemical agents. She denied the use of alcohol, tobacco or street drugs. She denied significant viral illnesses during the course of her pregnancy. Her medical and surgical histories were additionally noncontributory aside from the previously discussed medical history.     I counseled Ms. Arvil Chaco regarding the above risks and available options.  The approximate face-to-face time with the genetic counselor was 40 minutes.    Quinn Plowman, MS,  Certified Genetic Counselor 01/10/2012

## 2012-01-23 ENCOUNTER — Ambulatory Visit (HOSPITAL_COMMUNITY)
Admission: RE | Admit: 2012-01-23 | Discharge: 2012-01-23 | Disposition: A | Discharge status: Home / Self Care | Payer: 59 | Source: Ambulatory Visit | Attending: Obstetrics and Gynecology | Admitting: Obstetrics and Gynecology

## 2012-01-23 ENCOUNTER — Other Ambulatory Visit: Payer: Self-pay

## 2012-01-23 DIAGNOSIS — Z31438 Encounter for other genetic testing of female for procreative management: Secondary | ICD-10-CM | POA: Insufficient documentation

## 2012-01-24 LAB — OB RESULTS CONSOLE HEPATITIS B SURFACE ANTIGEN: Hepatitis B Surface Ag: NEGATIVE

## 2012-01-24 LAB — OB RESULTS CONSOLE RPR: RPR: NONREACTIVE

## 2012-01-24 LAB — OB RESULTS CONSOLE GC/CHLAMYDIA: Chlamydia: NEGATIVE

## 2012-02-03 ENCOUNTER — Telehealth (HOSPITAL_COMMUNITY): Payer: Self-pay | Admitting: MS"

## 2012-02-03 NOTE — Telephone Encounter (Signed)
Called Jennifer Patterson to discuss her [facility], cell free fetal DNA testing. Testing was offered because of a previous pregnancy with Down syndrome. We reviewed that these are within normal limits, showing a less than 1 in 10,000 risk for trisomies 21, 18 and 13. We reviewed that this testing identifies > 99% of pregnancies with trisomy 21, >98% of pregnancies with trisomy 85, and >80% with trisomy 15; the false positive rate is <0.1% for all conditions. We discussed that testing was not performed for X and Y chromosome analysis given the indication.  We reviewed that targeted ultrasound is available to the patient in the second trimester. We are happy to perform this at our office, if desired. Ms. Jennifer Patterson also inquired about third trimester testing, such as BPPs, that were performed in her previous pregnancy. We discussed that this can also be scheduled through our office, if desired. We encouraged the patient to discuss the plan with her primary OB, who may perform these ultrasounds and testing through their office. If the patient and her OB prefer these to be performed through our office, then these can be scheduled.  She understands that this testing does not identify all genetic conditions. All questions were answered to her satisfaction, she was encouraged to call with additional questions or concerns.  Quinn Plowman, MS Patent attorney

## 2012-05-06 NOTE — L&D Delivery Note (Signed)
Patient was C/C/+4 and pushed for 2 minutes with epidural.   NSVD  female infant, Apgars 9,9, weight P.   The patient had no lacerations. Fundus was firm. EBL was expected. Placenta was delivered intact. Vagina was clear.  Baby was vigorous to bedside.  Esthela Brandner A

## 2012-06-09 ENCOUNTER — Encounter (HOSPITAL_COMMUNITY): Payer: Self-pay | Admitting: *Deleted

## 2012-06-09 ENCOUNTER — Inpatient Hospital Stay (HOSPITAL_COMMUNITY): Payer: 59

## 2012-06-09 ENCOUNTER — Inpatient Hospital Stay (HOSPITAL_COMMUNITY)
Admission: AD | Admit: 2012-06-09 | Discharge: 2012-06-09 | Disposition: A | Discharge status: Home / Self Care | Payer: 59 | Source: Ambulatory Visit | Attending: Obstetrics and Gynecology | Admitting: Obstetrics and Gynecology

## 2012-06-09 DIAGNOSIS — N390 Urinary tract infection, site not specified: Secondary | ICD-10-CM | POA: Insufficient documentation

## 2012-06-09 DIAGNOSIS — N949 Unspecified condition associated with female genital organs and menstrual cycle: Secondary | ICD-10-CM | POA: Insufficient documentation

## 2012-06-09 DIAGNOSIS — Z8751 Personal history of pre-term labor: Secondary | ICD-10-CM | POA: Insufficient documentation

## 2012-06-09 DIAGNOSIS — O2390 Unspecified genitourinary tract infection in pregnancy, unspecified trimester: Secondary | ICD-10-CM | POA: Insufficient documentation

## 2012-06-09 LAB — CBC WITH DIFFERENTIAL/PLATELET
Basophils Absolute: 0 10*3/uL (ref 0.0–0.1)
HCT: 32.6 % — ABNORMAL LOW (ref 36.0–46.0)
Lymphocytes Relative: 22 % (ref 12–46)
Lymphs Abs: 2.6 10*3/uL (ref 0.7–4.0)
Monocytes Absolute: 1 10*3/uL (ref 0.1–1.0)
Neutro Abs: 8.1 10*3/uL — ABNORMAL HIGH (ref 1.7–7.7)
Platelets: 270 10*3/uL (ref 150–400)
RBC: 3.78 MIL/uL — ABNORMAL LOW (ref 3.87–5.11)
RDW: 13.8 % (ref 11.5–15.5)
WBC: 11.8 10*3/uL — ABNORMAL HIGH (ref 4.0–10.5)

## 2012-06-09 LAB — URINALYSIS, ROUTINE W REFLEX MICROSCOPIC
Bilirubin Urine: NEGATIVE
Glucose, UA: 100 mg/dL — AB
Ketones, ur: NEGATIVE mg/dL
Protein, ur: NEGATIVE mg/dL
pH: 6.5 (ref 5.0–8.0)

## 2012-06-09 LAB — COMPREHENSIVE METABOLIC PANEL
Alkaline Phosphatase: 168 U/L — ABNORMAL HIGH (ref 39–117)
BUN: 7 mg/dL (ref 6–23)
Creatinine, Ser: 0.55 mg/dL (ref 0.50–1.10)
GFR calc Af Amer: >90 mL/min (ref >90)
Glucose, Bld: 95 mg/dL (ref 70–99)
Potassium: 4.3 mEq/L (ref 3.5–5.1)
Total Protein: 6.9 g/dL (ref 6.0–8.3)

## 2012-06-09 LAB — URINE MICROSCOPIC-ADD ON

## 2012-06-09 LAB — WET PREP, GENITAL
Clue Cells Wet Prep HPF POC: NONE SEEN
Trich, Wet Prep: NONE SEEN
Yeast Wet Prep HPF POC: NONE SEEN

## 2012-06-09 LAB — FETAL FIBRONECTIN: Fetal Fibronectin: NEGATIVE

## 2012-06-09 LAB — RPR: RPR Ser Ql: NONREACTIVE

## 2012-06-09 MED ORDER — CEPHALEXIN 500 MG PO CAPS: 500.0000 mg | ORAL_CAPSULE | Freq: Four times a day (QID) | ORAL | Status: DC

## 2012-06-09 MED ORDER — HYDROXYPROGESTERONE CAPROATE 250 MG/ML IM OIL
250.0000 mg | TOPICAL_OIL | Freq: Once | INTRAMUSCULAR | Status: AC
  Administered 2012-06-09: 250 mg via INTRAMUSCULAR
  Filled 2012-06-09: qty 1

## 2012-06-09 NOTE — MAU Provider Note (Signed)
History     CSN: [id number]  Arrival date and time: 06/09/12 0906   First Provider Initiated Contact with Patient 06/09/12 1007      Chief Complaint  Patient presents with  . Vaginal Pain   HPI Jennifer Patterson is a 32 y.o. female @ [redacted]w[redacted]d gestation who presents to MAU with vaginal pain. The pain started this morning. She describes the pain as pressure. She rates her pain as 2/10. She was due progesterone injection last week and didn't get it due to the bad weather. She called the office today to try and get the injection but when she told them she was having pressure she was directed to MAU. Associated symptoms include headache and nausea. Last sexual intercourse over a week ago. The history was provided by the patient.  OB History    Grav Para Term Preterm Abortions TAB SAB Ect Mult Living   7 3 1 2 3 2 1   2       Past Medical History  Diagnosis Date  . UTI (urinary tract infection)     frequent during pregnancy  . Chlamydia   . PID (pelvic inflammatory disease)   . Anemia     during pregnancy  . Allergy   . Ovarian cyst     history of  . Contraception management     6 pregnancies, 1 SAB, 3 TAB, 2 live births; failed [place]  and Depo Provera prior due to adverse effects    Past Surgical History  Procedure Date  . Dilation and curettage of uterus   . Tonsillectomy   . Appendectomy 2002  . Hernia repair     umbilical    Family History  Problem Relation Age of Onset  . Depression    . Diabetes      paternal side  . Hypertension Paternal Grandmother   . Diabetes Paternal Grandmother   . Cancer Mother 30    cervical  . Hypertension Mother   . Thyroid disease Mother   . Stroke Maternal Aunt   . Cancer Paternal Grandfather     colon  . Heart disease Neg Hx     History  Substance Use Topics  . Smoking status: Former Smoker    Quit date: 05/06/2005  . Smokeless tobacco: Never Used  . Alcohol Use: No    Allergies: No Known Allergies  Prescriptions  prior to admission  Medication Sig Dispense Refill  . hydroxyprogesterone caproate (DELALUTIN) 250 mg/mL OIL Inject 250 mg into the muscle once. Last does on 05-27-12      . Prenatal Vit-Fe Fumarate-FA (MULTIVITAMIN-PRENATAL) 27-0.8 MG TABS Take 1 tablet by mouth daily.        Review of Systems  Constitutional: Negative for fever, chills and weight loss.  HENT: Negative for ear pain, nosebleeds, congestion, sore throat and neck pain.   Eyes: Negative for blurred vision, double vision, photophobia and pain.  Respiratory: Negative for cough, shortness of breath and wheezing.   Cardiovascular: Negative for chest pain, palpitations and leg swelling.  Gastrointestinal: Positive for nausea and abdominal pain (pressure). Negative for heartburn, vomiting, diarrhea and constipation.  Genitourinary: Positive for frequency. Negative for dysuria and urgency.  Musculoskeletal: Positive for back pain. Negative for myalgias.  Skin: Negative for itching and rash.  Neurological: Positive for headaches. Negative for dizziness, sensory change, speech change, seizures and weakness.  Endo/Heme/Allergies: Does not bruise/bleed easily.  Psychiatric/Behavioral: Negative for depression and substance abuse. The patient is not nervous/anxious and does not have insomnia.  Physical Exam   Blood pressure 118/66, pulse 102, temperature 98 F (36.7 C), temperature source Oral, resp. rate 18, height 5' 7.75" (1.721 m), weight 230 lb (104.327 kg), last menstrual period 11/14/2011, SpO2 98.00%.  Physical Exam  Nursing note and vitals reviewed. Constitutional: She is oriented to person, place, and time. She appears well-developed and well-nourished. No distress.  HENT:  Head: Normocephalic and atraumatic.  Eyes: EOM are normal.  Neck: Neck supple.  Cardiovascular: Normal rate.   Respiratory: Effort normal.  GI: Soft. There is no tenderness.  Genitourinary:       External genitalia without lesions. Cervix internal  os closed.   Musculoskeletal: Normal range of motion.  Neurological: She is alert and oriented to person, place, and time.  Skin: Skin is warm and dry.  Psychiatric: She has a normal mood and affect. Her behavior is normal. Judgment and thought content normal.   Results for orders placed during the hospital encounter of 06/09/12 (from the past 24 hour(s))  URINALYSIS, ROUTINE W REFLEX MICROSCOPIC     Status: Abnormal   Collection Time   06/09/12  9:18 AM      Component Value Range   Color, Urine YELLOW  YELLOW   APPearance CLEAR  CLEAR   Specific Gravity, Urine 1.020  1.005 - 1.030   pH 6.5  5.0 - 8.0   Glucose, UA 100 (*) NEGATIVE mg/dL   Hgb urine dipstick TRACE (*) NEGATIVE   Bilirubin Urine NEGATIVE  NEGATIVE   Ketones, ur NEGATIVE  NEGATIVE mg/dL   Protein, ur NEGATIVE  NEGATIVE mg/dL   Urobilinogen, UA 0.2  0.0 - 1.0 mg/dL   Nitrite NEGATIVE  NEGATIVE   Leukocytes, UA SMALL (*) NEGATIVE  URINE MICROSCOPIC-ADD ON     Status: Abnormal   Collection Time   06/09/12  9:18 AM      Component Value Range   Squamous Epithelial / LPF FEW (*) RARE   WBC, UA 0-2  <3 WBC/hpf   RBC / HPF 0-2  <3 RBC/hpf   Bacteria, UA RARE  RARE  FETAL FIBRONECTIN     Status: Normal   Collection Time   06/09/12 12:15 PM      Component Value Range   Fetal Fibronectin NEGATIVE  NEGATIVE  WET PREP, GENITAL     Status: Abnormal   Collection Time   06/09/12 12:15 PM      Component Value Range   Yeast Wet Prep HPF POC NONE SEEN  NONE SEEN   Trich, Wet Prep NONE SEEN  NONE SEEN   Clue Cells Wet Prep HPF POC NONE SEEN  NONE SEEN   WBC, Wet Prep HPF POC MANY (*) NONE SEEN  COMPREHENSIVE METABOLIC PANEL     Status: Abnormal   Collection Time   06/09/12  1:07 PM      Component Value Range   Sodium 136  135 - 145 mEq/L   Potassium 4.3  3.5 - 5.1 mEq/L   Chloride 102  96 - 112 mEq/L   CO2 21  19 - 32 mEq/L   Glucose, Bld 95  70 - 99 mg/dL   BUN 7  6 - 23 mg/dL   Creatinine, Ser 6.57  0.50 - 1.10 mg/dL    Calcium 9.4  8.4 - 84.6 mg/dL   Total Protein 6.9  6.0 - 8.3 g/dL   Albumin 3.0 (*) 3.5 - 5.2 g/dL   AST 12  0 - 37 U/L   ALT 10  0 - 35  U/L   Alkaline Phosphatase 168 (*) 39 - 117 U/L   Total Bilirubin 0.2 (*) 0.3 - 1.2 mg/dL   GFR calc non Af Amer >90  >90 mL/min   GFR calc Af Amer >90  >90 mL/min    EFM: base line 130   Reactive tracing, no contractions Procedures  Assessment: 32 y.o. female @ [redacted]w[redacted]d gestation with pelvic pressure   Cervical length 2.6   Early UTI  Plan:  Delalutin 250 mg IM   Culture urine   Treat UTI   Follow up in the office, return as needed I have reviewed this patient's vital signs, nurses notes, appropriate labs and imaging.  I have discussed findings with the patient and follow up plan of care. Patient voices understanding.   Medication List     As of 06/09/2012  2:53 PM    START taking these medications         cephALEXin 500 MG capsule   Commonly known as: KEFLEX   Take 1 capsule (500 mg total) by mouth 4 (four) times daily.      CONTINUE taking these medications         hydroxyprogesterone caproate 250 mg/mL Oil   Commonly known as: DELALUTIN      multivitamin-prenatal 27-0.8 MG Tabs          Where to get your medications    These are the prescriptions that you need to pick up. We sent them to a specific pharmacy, so you will need to go there to get them.   [id number] [address] [address], [place] - [address] AT [facility] [address]    [address] [place] [address]    Phone: [phone]        cephALEXin 500 MG capsule             NEESE,HOPE, RN, FNP, [facility] 06/09/2012, 1:56 PM   14:30 discussed with Dr. Henderson Cloud lab, u/s and clinical findings

## 2012-06-09 NOTE — MAU Note (Signed)
Pt states vaginal pain/pressure noted since this am, denies bleeding or lof.

## 2012-06-10 LAB — URINE CULTURE

## 2012-06-10 LAB — GC/CHLAMYDIA PROBE AMP: CT Probe RNA: NEGATIVE

## 2012-07-16 ENCOUNTER — Encounter (HOSPITAL_COMMUNITY): Payer: Self-pay

## 2012-07-16 ENCOUNTER — Inpatient Hospital Stay (HOSPITAL_COMMUNITY)
Admission: AD | Admit: 2012-07-16 | Discharge: 2012-07-16 | Disposition: A | Discharge status: Home / Self Care | Payer: 59 | Source: Ambulatory Visit | Attending: Obstetrics and Gynecology | Admitting: Obstetrics and Gynecology

## 2012-07-16 DIAGNOSIS — R42 Dizziness and giddiness: Secondary | ICD-10-CM | POA: Insufficient documentation

## 2012-07-16 DIAGNOSIS — R000 Tachycardia, unspecified: Secondary | ICD-10-CM

## 2012-07-16 DIAGNOSIS — O99891 Other specified diseases and conditions complicating pregnancy: Secondary | ICD-10-CM | POA: Insufficient documentation

## 2012-07-16 DIAGNOSIS — I498 Other specified cardiac arrhythmias: Secondary | ICD-10-CM | POA: Insufficient documentation

## 2012-07-16 LAB — CBC
Hemoglobin: 10.3 g/dL — ABNORMAL LOW (ref 12.0–15.0)
MCH: 27.8 pg (ref 26.0–34.0)
MCV: 84.1 fL (ref 78.0–100.0)
RBC: 3.7 MIL/uL — ABNORMAL LOW (ref 3.87–5.11)

## 2012-07-16 LAB — COMPREHENSIVE METABOLIC PANEL
ALT: 10 U/L (ref 0–35)
BUN: 9 mg/dL (ref 6–23)
CO2: 21 mEq/L (ref 19–32)
Calcium: 9.4 mg/dL (ref 8.4–10.5)
Creatinine, Ser: 0.62 mg/dL (ref 0.50–1.10)
GFR calc Af Amer: >90 mL/min (ref >90)
GFR calc non Af Amer: >90 mL/min (ref >90)
Glucose, Bld: 166 mg/dL — ABNORMAL HIGH (ref 70–99)

## 2012-07-16 LAB — URINALYSIS, ROUTINE W REFLEX MICROSCOPIC
Bilirubin Urine: NEGATIVE
Nitrite: NEGATIVE
Specific Gravity, Urine: 1.02 (ref 1.005–1.030)
Urobilinogen, UA: 0.2 mg/dL (ref 0.0–1.0)

## 2012-07-16 LAB — URINE MICROSCOPIC-ADD ON

## 2012-07-16 MED ORDER — ONDANSETRON 8 MG PO TBDP
8.0000 mg | ORAL_TABLET | Freq: Once | ORAL | Status: AC
  Administered 2012-07-16: 8 mg via ORAL
  Filled 2012-07-16: qty 1

## 2012-07-16 NOTE — MAU Provider Note (Signed)
Chief Complaint:  No chief complaint on file.   None     HPI: Jennifer Patterson is a 32 y.o. [phone] at 7w0dwho presents to maternity admissions reporting episode of dizziness and nausea while standing in line for food at work yesterday with ongoing intermittent nausea and dizziness today. She also reports feeling short of breath this morning.  She reports good fetal movement, denies LOF, vaginal bleeding, vaginal itching/burning, urinary symptoms, h/a, or fever/chills.    Pt had cardiology eval during previous pregnancy for similar symptoms, with no need for ongoing cardiology found.  She was out of work during the final weeks of her previous pregnancy for her symptoms.  Past Medical History: Past Medical History  Diagnosis Date  . UTI (urinary tract infection)     frequent during pregnancy  . Chlamydia   . PID (pelvic inflammatory disease)   . Anemia     during pregnancy  . Allergy   . Ovarian cyst     history of  . Contraception management     6 pregnancies, 1 SAB, 3 TAB, 2 live births; failed [place]  and Depo Provera prior due to adverse effects  . Preterm labor     Past obstetric history: OB History   Grav Para Term Preterm Abortions TAB SAB Ect Mult Living   7 3 1 2 3 2 1   2      # Outc Date GA Lbr Len/2nd Wgt Sex Del Anes PTL Lv   1 TRM            2 PRE  [redacted]w[redacted]d       No   Comments: stillbirth   3 SAB            4 TAB            5 TAB            6 PRE  [redacted]w[redacted]d          7 CUR               Past Surgical History: Past Surgical History  Procedure Laterality Date  . Dilation and curettage of uterus    . Tonsillectomy    . Appendectomy  2002  . Hernia repair      umbilical    Family History: Family History  Problem Relation Age of Onset  . Depression    . Diabetes      paternal side  . Hypertension Paternal Grandmother   . Diabetes Paternal Grandmother   . Cancer Mother 30    cervical  . Hypertension Mother   . Thyroid disease Mother   . Stroke  Maternal Aunt   . Cancer Paternal Grandfather     colon  . Heart disease Neg Hx     Social History: History  Substance Use Topics  . Smoking status: Former Smoker    Quit date: 05/06/2005  . Smokeless tobacco: Never Used  . Alcohol Use: No    Allergies: No Known Allergies  Meds:  Prescriptions prior to admission  Medication Sig Dispense Refill  . cephALEXin (KEFLEX) 500 MG capsule Take 1 capsule (500 mg total) by mouth 4 (four) times daily.  28 capsule  0  . hydroxyprogesterone caproate (DELALUTIN) 250 mg/mL OIL Inject 250 mg into the muscle once. Last does on 05-27-12      . Prenatal Vit-Fe Fumarate-FA (MULTIVITAMIN-PRENATAL) 27-0.8 MG TABS Take 1 tablet by mouth daily.        ROS: Pertinent findings  in history of present illness.  Physical Exam  Blood pressure 114/65, pulse 122, temperature 98.1 F (36.7 C), temperature source Oral, resp. rate 20, height 5\' 7"  (1.702 m), weight 110.224 kg (243 lb), last menstrual period 11/14/2011, SpO2 100.00%. GENERAL: Well-developed, well-nourished female in no acute distress.  HEENT: normocephalic HEART: normal rate RESP: normal effort ABDOMEN: Soft, non-tender, gravid appropriate for gestational age EXTREMITIES: Nontender, no edema NEURO: alert and oriented SPECULUM EXAM:    FHT:  Baseline 140 , moderate variability, accelerations present, isolated 30 second variable down to 90 during contraction Contractions: q 5-6 mins, mild to palpation   EKG with sinus tachycardia, read by Dr Joseline Mccampbell Fredrickson, cardiologist.  Labs: Results for orders placed during the hospital encounter of 07/16/12 (from the past 24 hour(s))  CBC     Status: Abnormal   Collection Time    07/16/12  1:07 PM      Result Value Range   WBC 11.3 (*) 4.0 - 10.5 K/uL   RBC 3.70 (*) 3.87 - 5.11 MIL/uL   Hemoglobin 10.3 (*) 12.0 - 15.0 g/dL   HCT 16.1 (*) 09.6 - 04.5 %   MCV 84.1  78.0 - 100.0 fL   MCH 27.8  26.0 - 34.0 pg   MCHC 33.1  30.0 - 36.0 g/dL   RDW 40.9   81.1 - 91.4 %   Platelets 271  150 - 400 K/uL  COMPREHENSIVE METABOLIC PANEL     Status: Abnormal   Collection Time    07/16/12  1:07 PM      Result Value Range   Sodium 134 (*) 135 - 145 mEq/L   Potassium 4.3  3.5 - 5.1 mEq/L   Chloride 101  96 - 112 mEq/L   CO2 21  19 - 32 mEq/L   Glucose, Bld 166 (*) 70 - 99 mg/dL   BUN 9  6 - 23 mg/dL   Creatinine, Ser 7.82  0.50 - 1.10 mg/dL   Calcium 9.4  8.4 - 95.6 mg/dL   Total Protein 6.5  6.0 - 8.3 g/dL   Albumin 2.7 (*) 3.5 - 5.2 g/dL   AST 17  0 - 37 U/L   ALT 10  0 - 35 U/L   Alkaline Phosphatase 376 (*) 39 - 117 U/L   Total Bilirubin 0.2 (*) 0.3 - 1.2 mg/dL   GFR calc non Af Amer >90  >90 mL/min   GFR calc Af Amer >90  >90 mL/min  URINALYSIS, ROUTINE W REFLEX MICROSCOPIC     Status: Abnormal   Collection Time    07/16/12  3:00 PM      Result Value Range   Color, Urine YELLOW  YELLOW   APPearance CLEAR  CLEAR   Specific Gravity, Urine 1.020  1.005 - 1.030   pH 6.0  5.0 - 8.0   Glucose, UA NEGATIVE  NEGATIVE mg/dL   Hgb urine dipstick TRACE (*) NEGATIVE   Bilirubin Urine NEGATIVE  NEGATIVE   Ketones, ur NEGATIVE  NEGATIVE mg/dL   Protein, ur NEGATIVE  NEGATIVE mg/dL   Urobilinogen, UA 0.2  0.0 - 1.0 mg/dL   Nitrite NEGATIVE  NEGATIVE   Leukocytes, UA NEGATIVE  NEGATIVE  URINE MICROSCOPIC-ADD ON     Status: Abnormal   Collection Time    07/16/12  3:00 PM      Result Value Range   Squamous Epithelial / LPF FEW (*) RARE   WBC, UA 0-2  <3 WBC/hpf   RBC / HPF 0-2  <3  RBC/hpf   Bacteria, UA RARE  RARE   Crystals CA OXALATE CRYSTALS (*) NEGATIVE   Urine-Other MUCOUS PRESENT      Assessment: 1. Dizziness, nonspecific   2. Sinus tachycardia     Plan: Called Dr Dareen Piano to review assessment and findings Discharge home Letter for pt to be out of work or work from home on computer Increase PO fluids and iron rich food intake, take PNV daily with iron Labor precautions and fetal kick counts No need for cardiology f/u per Dr  Levy Cedano Fredrickson F/U in office this week Return to MAU as needed    Medication List    ASK your doctor about these medications       cephALEXin 500 MG capsule  Commonly known as:  KEFLEX  Take 1 capsule (500 mg total) by mouth 4 (four) times daily.     hydroxyprogesterone caproate 250 mg/mL Oil  Commonly known as:  DELALUTIN  Inject 250 mg into the muscle once. Last does on 05-27-12     multivitamin-prenatal 27-0.8 MG Tabs  Take 1 tablet by mouth daily.        Sharen Counter Certified Nurse-Midwife 07/16/2012 1:08 PM

## 2012-07-16 NOTE — MAU Note (Signed)
Went over to Dr today (saw Dr Dareen Piano), he sent her over.  A couple days ago, got really hot, really dizzy, felt like she was going to faint- sat down- took about an hour for feeling to pass.  Has returned frequently- becomes short of breath and nauseated.

## 2012-07-16 NOTE — MAU Note (Signed)
Pt sent from office for eval of sob, heart rate going up, feels light headed and dizzy. Denies bleeding or abnormal vaginal discharge.

## 2012-07-16 NOTE — MAU Note (Signed)
Name and DOB verified, pt confirmed spelling is correct on armband

## 2012-07-25 ENCOUNTER — Inpatient Hospital Stay (HOSPITAL_COMMUNITY)
Admission: EM | Admit: 2012-07-25 | Discharge: 2012-07-25 | Disposition: A | Discharge status: Home / Self Care | Payer: 59 | Source: Ambulatory Visit | Attending: Obstetrics and Gynecology | Admitting: Obstetrics and Gynecology

## 2012-07-25 ENCOUNTER — Encounter (HOSPITAL_COMMUNITY): Payer: Self-pay | Admitting: *Deleted

## 2012-07-25 DIAGNOSIS — O4700 False labor before 37 completed weeks of gestation, unspecified trimester: Secondary | ICD-10-CM | POA: Insufficient documentation

## 2012-07-25 NOTE — MAU Note (Signed)
Pt reports she has been having ctx on and off since this morning. Good fetal movement reported and denies SROM or bleeding at this time

## 2012-08-01 ENCOUNTER — Encounter (HOSPITAL_COMMUNITY): Payer: Self-pay

## 2012-08-01 ENCOUNTER — Inpatient Hospital Stay (HOSPITAL_COMMUNITY)
Admission: AD | Admit: 2012-08-01 | Discharge: 2012-08-02 | Disposition: A | Discharge status: Home / Self Care | Payer: 59 | Source: Ambulatory Visit | Attending: Obstetrics and Gynecology | Admitting: Obstetrics and Gynecology

## 2012-08-01 DIAGNOSIS — O479 False labor, unspecified: Secondary | ICD-10-CM | POA: Insufficient documentation

## 2012-08-01 LAB — OB RESULTS CONSOLE GBS: GBS: POSITIVE

## 2012-08-01 NOTE — MAU Note (Signed)
Dr. Dareen Piano notified of pt.  Orders to watch and recheck pt  rec'd.

## 2012-08-13 ENCOUNTER — Inpatient Hospital Stay (HOSPITAL_COMMUNITY)
Admission: AD | Admit: 2012-08-13 | Discharge: 2012-08-15 | DRG: 775 | Disposition: A | Discharge status: Home / Self Care | Payer: 59 | Source: Ambulatory Visit | Attending: Obstetrics and Gynecology | Admitting: Obstetrics and Gynecology

## 2012-08-13 ENCOUNTER — Encounter (HOSPITAL_COMMUNITY): Payer: Self-pay | Admitting: Anesthesiology

## 2012-08-13 ENCOUNTER — Inpatient Hospital Stay (HOSPITAL_COMMUNITY): Payer: 59 | Admitting: Anesthesiology

## 2012-08-13 ENCOUNTER — Encounter (HOSPITAL_COMMUNITY): Payer: Self-pay

## 2012-08-13 DIAGNOSIS — O99892 Other specified diseases and conditions complicating childbirth: Principal | ICD-10-CM | POA: Diagnosis present

## 2012-08-13 DIAGNOSIS — Z22330 Carrier of Group B streptococcus: Secondary | ICD-10-CM

## 2012-08-13 LAB — TYPE AND SCREEN
ABO/RH(D): B POS
Antibody Screen: NEGATIVE

## 2012-08-13 LAB — CBC
MCV: 83.2 fL (ref 78.0–100.0)
Platelets: 266 10*3/uL (ref 150–400)
RBC: 4.04 MIL/uL (ref 3.87–5.11)
WBC: 11.7 10*3/uL — ABNORMAL HIGH (ref 4.0–10.5)

## 2012-08-13 LAB — ABO/RH: ABO/RH(D): B POS

## 2012-08-13 MED ORDER — MEASLES, MUMPS & RUBELLA VAC ~~LOC~~ INJ
0.5000 mL | INJECTION | Freq: Once | SUBCUTANEOUS | Status: DC
  Filled 2012-08-13: qty 0.5

## 2012-08-13 MED ORDER — OXYTOCIN 40 UNITS IN LACTATED RINGERS INFUSION - SIMPLE MED
62.5000 mL/h | INTRAVENOUS | Status: DC
  Administered 2012-08-13: 62.5 mL/h via INTRAVENOUS
  Filled 2012-08-13: qty 1000

## 2012-08-13 MED ORDER — TETANUS-DIPHTH-ACELL PERTUSSIS 5-2.5-18.5 LF-MCG/0.5 IM SUSP
0.5000 mL | Freq: Once | INTRAMUSCULAR | Status: AC
  Administered 2012-08-14: 0.5 mL via INTRAMUSCULAR
  Filled 2012-08-13: qty 0.5

## 2012-08-13 MED ORDER — SODIUM CHLORIDE 0.9 % IV SOLN: 250.0000 mL | INTRAVENOUS | Status: DC | PRN

## 2012-08-13 MED ORDER — ZOLPIDEM TARTRATE 5 MG PO TABS: 5.0000 mg | ORAL_TABLET | Freq: Every evening | ORAL | Status: DC | PRN

## 2012-08-13 MED ORDER — IBUPROFEN 600 MG PO TABS
600.0000 mg | ORAL_TABLET | Freq: Four times a day (QID) | ORAL | Status: DC | PRN
  Administered 2012-08-13: 600 mg via ORAL
  Filled 2012-08-13: qty 1

## 2012-08-13 MED ORDER — BUTORPHANOL TARTRATE 1 MG/ML IJ SOLN
1.0000 mg | INTRAMUSCULAR | Status: DC | PRN
  Administered 2012-08-13 (×2): 1 mg via INTRAVENOUS
  Filled 2012-08-13 (×2): qty 1

## 2012-08-13 MED ORDER — SODIUM CHLORIDE 0.9 % IJ SOLN: 3.0000 mL | INTRAMUSCULAR | Status: DC | PRN

## 2012-08-13 MED ORDER — FLEET ENEMA 7-19 GM/118ML RE ENEM: 1.0000 | ENEMA | RECTAL | Status: DC | PRN

## 2012-08-13 MED ORDER — IBUPROFEN 800 MG PO TABS
800.0000 mg | ORAL_TABLET | Freq: Three times a day (TID) | ORAL | Status: DC
  Administered 2012-08-13 – 2012-08-15 (×6): 800 mg via ORAL
  Filled 2012-08-13 (×6): qty 1

## 2012-08-13 MED ORDER — METHYLERGONOVINE MALEATE 0.2 MG/ML IJ SOLN: 0.2000 mg | INTRAMUSCULAR | Status: DC | PRN

## 2012-08-13 MED ORDER — ONDANSETRON HCL 4 MG/2ML IJ SOLN: 4.0000 mg | Freq: Four times a day (QID) | INTRAMUSCULAR | Status: DC | PRN

## 2012-08-13 MED ORDER — ONDANSETRON HCL 4 MG/2ML IJ SOLN: 4.0000 mg | INTRAMUSCULAR | Status: DC | PRN

## 2012-08-13 MED ORDER — LIDOCAINE HCL (PF) 1 % IJ SOLN
30.0000 mL | INTRAMUSCULAR | Status: DC | PRN
  Filled 2012-08-13 (×2): qty 30

## 2012-08-13 MED ORDER — ONDANSETRON HCL 4 MG PO TABS: 4.0000 mg | ORAL_TABLET | ORAL | Status: DC | PRN

## 2012-08-13 MED ORDER — LACTATED RINGERS IV SOLN: INTRAVENOUS | Status: DC

## 2012-08-13 MED ORDER — DIBUCAINE 1 % RE OINT: 1.0000 "application " | TOPICAL_OINTMENT | RECTAL | Status: DC | PRN

## 2012-08-13 MED ORDER — TERBUTALINE SULFATE 1 MG/ML IJ SOLN: 0.2500 mg | Freq: Once | INTRAMUSCULAR | Status: DC | PRN

## 2012-08-13 MED ORDER — MAGNESIUM HYDROXIDE 400 MG/5ML PO SUSP: 30.0000 mL | ORAL | Status: DC | PRN

## 2012-08-13 MED ORDER — PRENATAL MULTIVITAMIN CH
1.0000 | ORAL_TABLET | Freq: Every day | ORAL | Status: DC
  Administered 2012-08-13 – 2012-08-15 (×3): 1 via ORAL
  Filled 2012-08-13 (×3): qty 1

## 2012-08-13 MED ORDER — FERROUS SULFATE 325 (65 FE) MG PO TABS
325.0000 mg | ORAL_TABLET | Freq: Two times a day (BID) | ORAL | Status: DC
  Administered 2012-08-13 – 2012-08-15 (×4): 325 mg via ORAL
  Filled 2012-08-13 (×4): qty 1

## 2012-08-13 MED ORDER — METHYLERGONOVINE MALEATE 0.2 MG PO TABS: 0.2000 mg | ORAL_TABLET | ORAL | Status: DC | PRN

## 2012-08-13 MED ORDER — OXYTOCIN BOLUS FROM INFUSION: 500.0000 mL | INTRAVENOUS | Status: DC

## 2012-08-13 MED ORDER — WITCH HAZEL-GLYCERIN EX PADS: 1.0000 "application " | MEDICATED_PAD | CUTANEOUS | Status: DC | PRN

## 2012-08-13 MED ORDER — LANOLIN HYDROUS EX OINT: TOPICAL_OINTMENT | CUTANEOUS | Status: DC | PRN

## 2012-08-13 MED ORDER — OXYCODONE-ACETAMINOPHEN 5-325 MG PO TABS: 1.0000 | ORAL_TABLET | ORAL | Status: DC | PRN

## 2012-08-13 MED ORDER — SENNOSIDES-DOCUSATE SODIUM 8.6-50 MG PO TABS
2.0000 | ORAL_TABLET | Freq: Every day | ORAL | Status: DC
  Administered 2012-08-13 – 2012-08-14 (×2): 2 via ORAL

## 2012-08-13 MED ORDER — EPHEDRINE 5 MG/ML INJ
10.0000 mg | INTRAVENOUS | Status: DC | PRN
  Filled 2012-08-13: qty 2

## 2012-08-13 MED ORDER — SODIUM CHLORIDE 0.9 % IJ SOLN: 3.0000 mL | Freq: Two times a day (BID) | INTRAMUSCULAR | Status: DC

## 2012-08-13 MED ORDER — LACTATED RINGERS IV SOLN
500.0000 mL | Freq: Once | INTRAVENOUS | Status: AC
  Administered 2012-08-13: 500 mL via INTRAVENOUS

## 2012-08-13 MED ORDER — ACETAMINOPHEN 325 MG PO TABS: 650.0000 mg | ORAL_TABLET | ORAL | Status: DC | PRN

## 2012-08-13 MED ORDER — FENTANYL 2.5 MCG/ML BUPIVACAINE 1/10 % EPIDURAL INFUSION (WH - ANES)
14.0000 mL/h | INTRAMUSCULAR | Status: DC | PRN
  Filled 2012-08-13: qty 125

## 2012-08-13 MED ORDER — FENTANYL 2.5 MCG/ML BUPIVACAINE 1/10 % EPIDURAL INFUSION (WH - ANES)
INTRAMUSCULAR | Status: DC | PRN
  Administered 2012-08-13: 14 mL/h via EPIDURAL

## 2012-08-13 MED ORDER — DIPHENHYDRAMINE HCL 50 MG/ML IJ SOLN: 12.5000 mg | INTRAMUSCULAR | Status: DC | PRN

## 2012-08-13 MED ORDER — PHENYLEPHRINE 40 MCG/ML (10ML) SYRINGE FOR IV PUSH (FOR BLOOD PRESSURE SUPPORT)
80.0000 ug | PREFILLED_SYRINGE | INTRAVENOUS | Status: DC | PRN
  Filled 2012-08-13: qty 5
  Filled 2012-08-13: qty 2

## 2012-08-13 MED ORDER — DIPHENHYDRAMINE HCL 25 MG PO CAPS: 25.0000 mg | ORAL_CAPSULE | Freq: Four times a day (QID) | ORAL | Status: DC | PRN

## 2012-08-13 MED ORDER — PENICILLIN G POTASSIUM 5000000 UNITS IJ SOLR
5.0000 10*6.[IU] | Freq: Once | INTRAVENOUS | Status: AC
  Administered 2012-08-13: 5 10*6.[IU] via INTRAVENOUS
  Filled 2012-08-13: qty 5

## 2012-08-13 MED ORDER — PENICILLIN G POTASSIUM 5000000 UNITS IJ SOLR
2.5000 10*6.[IU] | INTRAMUSCULAR | Status: DC
  Administered 2012-08-13 (×2): 2.5 10*6.[IU] via INTRAVENOUS
  Filled 2012-08-13 (×3): qty 2.5

## 2012-08-13 MED ORDER — PHENYLEPHRINE 40 MCG/ML (10ML) SYRINGE FOR IV PUSH (FOR BLOOD PRESSURE SUPPORT)
80.0000 ug | PREFILLED_SYRINGE | INTRAVENOUS | Status: DC | PRN
  Administered 2012-08-13: 80 ug via INTRAVENOUS
  Filled 2012-08-13: qty 2

## 2012-08-13 MED ORDER — OXYTOCIN 40 UNITS IN LACTATED RINGERS INFUSION - SIMPLE MED
1.0000 m[IU]/min | INTRAVENOUS | Status: DC
  Administered 2012-08-13: 2 m[IU]/min via INTRAVENOUS

## 2012-08-13 MED ORDER — LACTATED RINGERS IV SOLN
500.0000 mL | INTRAVENOUS | Status: DC | PRN
  Administered 2012-08-13: 1000 mL via INTRAVENOUS

## 2012-08-13 MED ORDER — LIDOCAINE HCL (PF) 1 % IJ SOLN
INTRAMUSCULAR | Status: DC | PRN
  Administered 2012-08-13 (×2): 8 mL

## 2012-08-13 MED ORDER — SIMETHICONE 80 MG PO CHEW: 80.0000 mg | CHEWABLE_TABLET | ORAL | Status: DC | PRN

## 2012-08-13 MED ORDER — BENZOCAINE-MENTHOL 20-0.5 % EX AERO: 1.0000 "application " | INHALATION_SPRAY | CUTANEOUS | Status: DC | PRN

## 2012-08-13 MED ORDER — CITRIC ACID-SODIUM CITRATE 334-500 MG/5ML PO SOLN: 30.0000 mL | ORAL | Status: DC | PRN

## 2012-08-13 MED ORDER — EPHEDRINE 5 MG/ML INJ
10.0000 mg | INTRAVENOUS | Status: DC | PRN
  Filled 2012-08-13: qty 2
  Filled 2012-08-13: qty 4

## 2012-08-13 NOTE — Anesthesia Procedure Notes (Addendum)
Epidural Patient location during procedure: OB Start time: 08/13/2012 7:32 AM End time: 08/13/2012 7:36 AM  Staffing Anesthesiologist: Sandrea Hughs Performed by: anesthesiologist   Preanesthetic Checklist Completed: patient identified, site marked, surgical consent, pre-op evaluation, timeout performed, IV checked, risks and benefits discussed and monitors and equipment checked  Epidural Patient position: sitting Prep: site prepped and draped and DuraPrep Patient monitoring: continuous pulse ox and blood pressure Approach: midline Injection technique: LOR air  Needle:  Needle type: Tuohy  Needle gauge: 17 G Needle length: 9 cm and 9 Needle insertion depth: 6 cm Catheter type: closed end flexible Catheter size: 19 Gauge Catheter at skin depth: 11 cm Test dose: negative and Other  Assessment Sensory level: T9 Events: blood not aspirated, injection not painful, no injection resistance, negative IV test and no paresthesia  Additional Notes Reason for block:procedure for pain  Epidural

## 2012-08-13 NOTE — H&P (Signed)
32 y.o. [redacted]w[redacted]d  [id number] comes in c/o labor.  Otherwise has good fetal movement and no bleeding.  Past Medical History  Diagnosis Date  . UTI (urinary tract infection)     frequent during pregnancy  . Chlamydia   . PID (pelvic inflammatory disease)   . Anemia     during pregnancy  . Allergy   . Ovarian cyst     history of  . Contraception management     6 pregnancies, 1 SAB, 3 TAB, 2 live births; failed [place]  and Depo Provera prior due to adverse effects  . Preterm labor     Past Surgical History  Procedure Laterality Date  . Dilation and curettage of uterus    . Tonsillectomy    . Appendectomy  2002  . Hernia repair      umbilical    OB History   Grav Para Term Preterm Abortions TAB SAB Ect Mult Living   7 3 1 2 3 2 1   2      # Outc Date GA Lbr Len/2nd Wgt Sex Del Anes PTL Lv   1 TRM      SVD   Yes   2 PRE  [redacted]w[redacted]d       No   Comments: stillbirth   3 SAB            4 TAB            5 TAB            6 PRE  [redacted]w[redacted]d    SVD   Yes   7 CUR               History   Social History  . Marital Status: Single    Spouse Name: N/A    Number of Children: N/A  . Years of Education: N/A   Occupational History  . prior authorizations for [employer]   Social History Main Topics  . Smoking status: Former Smoker    Quit date: 05/06/2005  . Smokeless tobacco: Never Used  . Alcohol Use: No  . Drug Use: No  . Sexually Active: Yes    Birth Control/ Protection: None   Other Topics Concern  . Not on file   Social History Narrative   2 biological children and 1 step child, lives with her partner, exercise with walking   Review of patient's allergies indicates no known allergies.    Prenatal Transfer Tool  Maternal Diabetes: No Genetic Screening: Normal Maternal Ultrasounds/Referrals: Normal Fetal Ultrasounds or other Referrals:  None Maternal Substance Abuse:  No Significant Maternal Medications:  None Significant Maternal Lab Results: None  Other PNC: On  delalutin for hx PTL.    Filed Vitals:   08/13/12 0725  BP: 130/66  Pulse: 84  Temp:   Resp:      Lungs/Cor:  NAD Abdomen:  soft, gravid Ex:  no cords, erythema SVE:  8/C/_2, AROM clear FHTs:  120, good STV, NST R; one recent decel after epidural; now slightly decreased short term variability Toco:  q5   A/P   Term labor.  GBS positive.  Jennifer Patterson A

## 2012-08-13 NOTE — Transfer of Care (Incomplete)
Immediate Anesthesia Transfer of Care Note  Patient: Jennifer Patterson  Procedure(s) Performed: * No procedures listed *  Patient Location: {PLACES; ANE POST:19477::"PACU"}  Anesthesia Type:{PROCEDURES; ANE POST ANESTHESIA TYPE:19480}  Level of Consciousness: {FINDINGS; ANE POST LEVEL OF CONSCIOUSNESS:19484}  Airway & Oxygen Therapy: {Exam; oxygen device:30095}  Post-op Assessment: {ASSESSMENT;POST-OP ZOXWRU:04540}  Post vital signs: {DESC; ANE POST JWJXBJ:47829}  Complications: {FINDINGS; ANE POST COMPLICATIONS:19485}

## 2012-08-13 NOTE — Progress Notes (Signed)
Dr Dareen Piano notified of patients status, gestational age, cervical exam.  Orders to see if patient's changing in a few hours

## 2012-08-13 NOTE — Anesthesia Preprocedure Evaluation (Signed)
Anesthesia Evaluation  Patient identified by MRN, date of birth, ID band Patient awake    Reviewed: Allergy & Precautions, H&P , NPO status , Patient's Chart, lab work & pertinent test results  History of Anesthesia Complications (+) PONV  Airway Mallampati: II TM Distance: >3 FB Neck ROM: full    Dental no notable dental hx.    Pulmonary neg pulmonary ROS,    Pulmonary exam normal       Cardiovascular negative cardio ROS      Neuro/Psych negative neurological ROS  negative psych ROS   GI/Hepatic negative GI ROS, Neg liver ROS,   Endo/Other  negative endocrine ROS  Renal/GU negative Renal ROS  negative genitourinary   Musculoskeletal negative musculoskeletal ROS (+)   Abdominal Normal abdominal exam  (+)   Peds negative pediatric ROS (+)  Hematology negative hematology ROS (+)   Anesthesia Other Findings   Reproductive/Obstetrics (+) Pregnancy                           Anesthesia Physical Anesthesia Plan  ASA: II  Anesthesia Plan: Epidural   Post-op Pain Management:    Induction:   Airway Management Planned:   Additional Equipment:   Intra-op Plan:   Post-operative Plan:   Informed Consent: I have reviewed the patients History and Physical, chart, labs and discussed the procedure including the risks, benefits and alternatives for the proposed anesthesia with the patient or authorized representative who has indicated his/her understanding and acceptance.     Plan Discussed with:   Anesthesia Plan Comments:         Anesthesia Quick Evaluation

## 2012-08-13 NOTE — Progress Notes (Signed)
Delivery of live viable female by Dr Horvath. APGARS 9,9  

## 2012-08-13 NOTE — MAU Note (Signed)
Started having contractions at midnight

## 2012-08-14 LAB — CBC
HCT: 29.9 % — ABNORMAL LOW (ref 36.0–46.0)
Hemoglobin: 9.8 g/dL — ABNORMAL LOW (ref 12.0–15.0)
MCH: 27.3 pg (ref 26.0–34.0)
RBC: 3.59 MIL/uL — ABNORMAL LOW (ref 3.87–5.11)

## 2012-08-14 NOTE — Anesthesia Postprocedure Evaluation (Signed)
Anesthesia Post Note  Patient: @Jennifer Patterson @Jennifer Patterson   Procedure(s) Performed: CLE/C/S  Anesthesia type: Epidural  Patient location: Mother/Baby  Post pain: Pain level controlled  Post assessment: Post-op Vital signs reviewed  Last Vitals: BP 119/61  Pulse 98  Temp(Src) 36.8 C (Oral)  Resp 20  Ht 5\' 7"  (1.702 m)  Wt 245 lb (111.131 kg)  BMI 38.36 kg/m2  LMP 11/14/2011  Post vital signs: Reviewed  Level of consciousness: awake  Complications: No apparent anesthesia complications

## 2012-08-14 NOTE — Progress Notes (Signed)
Patient is eating, ambulating, voiding.  Pain control is good.  Appropriate lochia, no complaints.  Filed Vitals:   08/13/12 1356 08/13/12 1658 08/14/12 0100 08/14/12 0542  BP: 96/57 115/67 96/52 119/61  Pulse: 94 103 84 98  Temp: 98.4 F (36.9 C) 98 F (36.7 C) 98.7 F (37.1 C) 98.2 F (36.8 C)  TempSrc: Oral Oral Oral Oral  Resp: 18 16 18 20   Height:      Weight:        Fundus firm Perineum without swelling. No CT  Lab Results  Component Value Date   WBC 13.0* 08/14/2012   HGB 9.8* 08/14/2012   HCT 29.9* 08/14/2012   MCV 83.3 08/14/2012   PLT 220 08/14/2012    --/--/B POS, B POS (04/10 0350)  A/P Post partum day 1. Circ desired, consent obtained.  Routine care.  Expect d/c 4/12.    Philip Aspen

## 2012-08-15 NOTE — Discharge Summary (Signed)
Obstetric Discharge Summary Reason for Admission: onset of labor Prenatal Procedures: none Intrapartum Procedures: spontaneous vaginal delivery, GBS proph Postpartum Procedures: none Complications-Operative and Postpartum: none Hemoglobin  Date Value Range Status  08/14/2012 9.8* 12.0 - 15.0 g/dL Final     HCT  Date Value Range Status  08/14/2012 29.9* 36.0 - 46.0 % Final    Physical Exam:  General: alert and cooperative Lochia: appropriate Uterine Fundus: firm DVT Evaluation: No evidence of DVT seen on physical exam.  Discharge Diagnoses: Term Pregnancy-delivered  Discharge Information: Date: 08/15/2012 Activity: pelvic rest Diet: routine Medications: PNV and Ibuprofen Condition: stable Instructions: refer to practice specific booklet Discharge to: home Follow-up Information   Follow up with HORVATH,MICHELLE A, MD In 4 weeks.   Contact information:   [address] Dorothyann Gibbs [place] [address] [phone]       Newborn Data: Live born female  Birth Weight: 8 lb 11 oz (3941 g) APGAR: 9, 9  Home with mother.  Philip Aspen 08/15/2012, 9:54 AM

## 2012-09-15 ENCOUNTER — Other Ambulatory Visit: Payer: Self-pay | Admitting: Obstetrics and Gynecology

## 2014-03-07 ENCOUNTER — Encounter (HOSPITAL_COMMUNITY): Payer: Self-pay

## 2014-05-11 ENCOUNTER — Encounter: Payer: Self-pay | Admitting: Medical

## 2014-05-11 ENCOUNTER — Ambulatory Visit (INDEPENDENT_AMBULATORY_CARE_PROVIDER_SITE_OTHER): Payer: 59 | Admitting: Medical

## 2014-05-11 VITALS — BP 110/80 | HR 72 | Temp 98.4°F | Resp 16 | Wt 188.0 lb

## 2014-05-11 DIAGNOSIS — R8290 Unspecified abnormal findings in urine: Secondary | ICD-10-CM

## 2014-05-11 DIAGNOSIS — R110 Nausea: Secondary | ICD-10-CM

## 2014-05-11 DIAGNOSIS — R109 Unspecified abdominal pain: Secondary | ICD-10-CM

## 2014-05-11 LAB — POCT URINALYSIS DIPSTICK
BILIRUBIN UA: NEGATIVE
Glucose, UA: NEGATIVE
KETONES UA: NEGATIVE
Leukocytes, UA: NEGATIVE
Nitrite, UA: NEGATIVE
Protein, UA: NEGATIVE
Spec Grav, UA: 1.015
UROBILINOGEN UA: NEGATIVE
pH, UA: 6

## 2014-05-11 MED ORDER — CIPROFLOXACIN HCL 500 MG PO TABS: 500.0000 mg | ORAL_TABLET | Freq: Two times a day (BID) | ORAL | Status: DC

## 2014-05-11 MED ORDER — ONDANSETRON HCL 4 MG PO TABS: 4.0000 mg | ORAL_TABLET | Freq: Three times a day (TID) | ORAL | Status: DC | PRN

## 2014-05-11 NOTE — Progress Notes (Signed)
Subjective: Here today for possible kidney infection. She reports a history of urinary tract infection as well as pyelonephritis, hospitalized years ago for pyelonephritis.  she currently is having right flank pain and upper back pain on the right, odor in the urine. In general does not drink a lot of water.  Although she currently denies any other urinary symptoms she did have urinary frequency urgency and burning Christmas day and for a few days after that as well. Denies vaginal discharge or vaginal symptoms, last menstrual period 04/24/14, periods are regular, on Mirena IUD. No other symptoms or complaint  Past Medical History  Diagnosis Date  . UTI (urinary tract infection)     frequent during pregnancy  . Chlamydia   . PID (pelvic inflammatory disease)   . Anemia     during pregnancy  . Allergy   . Ovarian cyst     history of  . Contraception management     6 pregnancies, 1 SAB, 3 TAB, 2 live births; failed [place]  and Depo Provera prior due to adverse effects  . Preterm labor    Past Surgical History  Procedure Laterality Date  . Dilation and curettage of uterus    . Tonsillectomy    . Appendectomy  2002  . Hernia repair      umbilical   ROS as in subjective  Objective: BP 110/80 mmHg  Pulse 72  Temp(Src) 98.4 F (36.9 C) (Oral)  Resp 16  Wt 188 lb (85.276 kg)  General appearance: alert, no distress, WD/WN, +rigors Neck: supple, no lymphadenopathy, no thyromegaly, no masses Heart: RRR, normal S1, S2, no murmurs Lungs: CTA bilaterally, no wheezes, rhonchi, or rales Abdomen: +bs, soft, right flank tenderness, otherwise non tender, non distended, no masses, no hepatomegaly, no splenomegaly Pulses: 2+ symmetric, upper and lower extremities, normal cap refill Back: Right CVA tenderness   Assessment: Encounter Diagnoses  Name Primary?  . Flank pain Yes  . Nausea without vomiting   . Abnormal urine odor    Plan:  Labs today, urine culture sent, treat presumptively  for early pyelonephritis.  Begin Cipro, Zofran prn, rest, increase water intake, cranberry juice.  Advised if worsening the next 1-2 days including fever, worse pain, intractable nausea and vomiting, then return right away.  F/u pending labs.

## 2014-05-12 LAB — CBC WITH DIFFERENTIAL/PLATELET
BASOS ABS: 0 10*3/uL (ref 0.0–0.1)
Basophils Relative: 0 % (ref 0–1)
EOS ABS: 0.1 10*3/uL (ref 0.0–0.7)
EOS PCT: 1 % (ref 0–5)
HCT: 41.2 % (ref 36.0–46.0)
Hemoglobin: 14.2 g/dL (ref 12.0–15.0)
Lymphocytes Relative: 40 % (ref 12–46)
Lymphs Abs: 4 10*3/uL (ref 0.7–4.0)
MCH: 31 pg (ref 26.0–34.0)
MCHC: 34.5 g/dL (ref 30.0–36.0)
MCV: 90 fL (ref 78.0–100.0)
MPV: 11 fL (ref 8.6–12.4)
Monocytes Absolute: 0.6 10*3/uL (ref 0.1–1.0)
Monocytes Relative: 6 % (ref 3–12)
Neutro Abs: 5.3 10*3/uL (ref 1.7–7.7)
Neutrophils Relative %: 53 % (ref 43–77)
PLATELETS: 304 10*3/uL (ref 150–400)
RBC: 4.58 MIL/uL (ref 3.87–5.11)
RDW: 14.3 % (ref 11.5–15.5)
WBC: 10 10*3/uL (ref 4.0–10.5)

## 2014-05-12 LAB — BASIC METABOLIC PANEL
BUN: 9 mg/dL (ref 6–23)
CHLORIDE: 104 meq/L (ref 96–112)
CO2: 24 mEq/L (ref 19–32)
CREATININE: 0.83 mg/dL (ref 0.50–1.10)
Calcium: 10 mg/dL (ref 8.4–10.5)
GLUCOSE: 88 mg/dL (ref 70–99)
POTASSIUM: 4.5 meq/L (ref 3.5–5.3)
Sodium: 137 mEq/L (ref 135–145)

## 2014-05-12 NOTE — Progress Notes (Signed)
LM to CB

## 2014-05-13 ENCOUNTER — Encounter (HOSPITAL_COMMUNITY): Payer: Self-pay | Admitting: Emergency Medicine

## 2014-05-13 ENCOUNTER — Emergency Department (HOSPITAL_COMMUNITY)
Admission: EM | Admit: 2014-05-13 | Discharge: 2014-05-13 | Disposition: A | Discharge status: Home / Self Care | Payer: 59 | Attending: Emergency Medicine | Admitting: Emergency Medicine

## 2014-05-13 ENCOUNTER — Emergency Department (HOSPITAL_COMMUNITY): Payer: 59

## 2014-05-13 DIAGNOSIS — Z87440 Personal history of urinary (tract) infections: Secondary | ICD-10-CM | POA: Insufficient documentation

## 2014-05-13 DIAGNOSIS — Z3202 Encounter for pregnancy test, result negative: Secondary | ICD-10-CM | POA: Diagnosis not present

## 2014-05-13 DIAGNOSIS — Z8751 Personal history of pre-term labor: Secondary | ICD-10-CM | POA: Insufficient documentation

## 2014-05-13 DIAGNOSIS — Z87891 Personal history of nicotine dependence: Secondary | ICD-10-CM | POA: Diagnosis not present

## 2014-05-13 DIAGNOSIS — Z9089 Acquired absence of other organs: Secondary | ICD-10-CM | POA: Insufficient documentation

## 2014-05-13 DIAGNOSIS — N8320 Unspecified ovarian cysts: Secondary | ICD-10-CM | POA: Insufficient documentation

## 2014-05-13 DIAGNOSIS — Z8619 Personal history of other infectious and parasitic diseases: Secondary | ICD-10-CM | POA: Insufficient documentation

## 2014-05-13 DIAGNOSIS — N83209 Unspecified ovarian cyst, unspecified side: Secondary | ICD-10-CM

## 2014-05-13 DIAGNOSIS — R109 Unspecified abdominal pain: Secondary | ICD-10-CM

## 2014-05-13 LAB — CBC WITH DIFFERENTIAL/PLATELET
BASOS PCT: 0 % (ref 0–1)
Basophils Absolute: 0 10*3/uL (ref 0.0–0.1)
EOS PCT: 1 % (ref 0–5)
Eosinophils Absolute: 0.1 10*3/uL (ref 0.0–0.7)
HEMATOCRIT: 38.5 % (ref 36.0–46.0)
Hemoglobin: 12.9 g/dL (ref 12.0–15.0)
LYMPHS PCT: 44 % (ref 12–46)
Lymphs Abs: 4.2 10*3/uL — ABNORMAL HIGH (ref 0.7–4.0)
MCH: 29.7 pg (ref 26.0–34.0)
MCHC: 33.5 g/dL (ref 30.0–36.0)
MCV: 88.7 fL (ref 78.0–100.0)
Monocytes Absolute: 0.8 10*3/uL (ref 0.1–1.0)
Monocytes Relative: 8 % (ref 3–12)
Neutro Abs: 4.6 10*3/uL (ref 1.7–7.7)
Neutrophils Relative %: 47 % (ref 43–77)
Platelets: 252 10*3/uL (ref 150–400)
RBC: 4.34 MIL/uL (ref 3.87–5.11)
RDW: 13.4 % (ref 11.5–15.5)
WBC: 9.7 10*3/uL (ref 4.0–10.5)

## 2014-05-13 LAB — BASIC METABOLIC PANEL
Anion gap: 7 (ref 5–15)
BUN: 8 mg/dL (ref 6–23)
CO2: 24 mmol/L (ref 19–32)
CREATININE: 0.89 mg/dL (ref 0.50–1.10)
Calcium: 9.4 mg/dL (ref 8.4–10.5)
Chloride: 106 mEq/L (ref 96–112)
GFR calc Af Amer: >90 mL/min (ref >90)
GFR calc non Af Amer: 85 mL/min — ABNORMAL LOW (ref >90)
GLUCOSE: 99 mg/dL (ref 70–99)
Potassium: 3.7 mmol/L (ref 3.5–5.1)
Sodium: 137 mmol/L (ref 135–145)

## 2014-05-13 LAB — URINALYSIS, ROUTINE W REFLEX MICROSCOPIC
BILIRUBIN URINE: NEGATIVE
Glucose, UA: NEGATIVE mg/dL
Ketones, ur: NEGATIVE mg/dL
NITRITE: NEGATIVE
PH: 5.5 (ref 5.0–8.0)
Protein, ur: NEGATIVE mg/dL
SPECIFIC GRAVITY, URINE: 1.021 (ref 1.005–1.030)
Urobilinogen, UA: 0.2 mg/dL (ref 0.0–1.0)

## 2014-05-13 LAB — POC URINE PREG, ED: Preg Test, Ur: NEGATIVE

## 2014-05-13 LAB — WET PREP, GENITAL
Clue Cells Wet Prep HPF POC: NONE SEEN
TRICH WET PREP: NONE SEEN
Yeast Wet Prep HPF POC: NONE SEEN

## 2014-05-13 LAB — URINE MICROSCOPIC-ADD ON

## 2014-05-13 MED ORDER — ONDANSETRON HCL 4 MG PO TABS: 4.0000 mg | ORAL_TABLET | Freq: Four times a day (QID) | ORAL | Status: DC

## 2014-05-13 MED ORDER — STERILE WATER FOR INJECTION IJ SOLN
10.0000 mL | Freq: Once | INTRAMUSCULAR | Status: AC
  Administered 2014-05-13: 0.9 mL via INTRAMUSCULAR
  Filled 2014-05-13: qty 10

## 2014-05-13 MED ORDER — AZITHROMYCIN 250 MG PO TABS
1000.0000 mg | ORAL_TABLET | Freq: Once | ORAL | Status: AC
  Administered 2014-05-13: 1000 mg via ORAL
  Filled 2014-05-13: qty 4

## 2014-05-13 MED ORDER — HYDROCODONE-ACETAMINOPHEN 5-325 MG PO TABS: 1.0000 | ORAL_TABLET | Freq: Four times a day (QID) | ORAL | Status: DC | PRN

## 2014-05-13 MED ORDER — ONDANSETRON HCL 4 MG/2ML IJ SOLN
4.0000 mg | Freq: Once | INTRAMUSCULAR | Status: AC
  Administered 2014-05-13: 4 mg via INTRAVENOUS
  Filled 2014-05-13: qty 2

## 2014-05-13 MED ORDER — STERILE WATER FOR INJECTION IJ SOLN: 0.9000 mL | Freq: Once | INTRAMUSCULAR | Status: DC

## 2014-05-13 MED ORDER — HYDROMORPHONE HCL 1 MG/ML IJ SOLN
1.0000 mg | Freq: Once | INTRAMUSCULAR | Status: AC
  Administered 2014-05-13: 1 mg via INTRAVENOUS
  Filled 2014-05-13: qty 1

## 2014-05-13 MED ORDER — CEFTRIAXONE SODIUM 250 MG IJ SOLR
250.0000 mg | Freq: Once | INTRAMUSCULAR | Status: AC
  Administered 2014-05-13: 250 mg via INTRAMUSCULAR
  Filled 2014-05-13: qty 250

## 2014-05-13 NOTE — ED Notes (Signed)
Patient transported to CT 

## 2014-05-13 NOTE — ED Notes (Signed)
Patient reports having sharp pain starting last night around 8pm. She took hydrocodone to manage last night and did not help.  Patient reports she had a tooth pulled a year ago and used that pain medication from that. Appendix removed in 2002.

## 2014-05-13 NOTE — Discharge Instructions (Signed)
Take pain medication as prescribed.  Do not drive or operate heavy machinery for 4-6 hours after taking medication. °

## 2014-05-13 NOTE — ED Provider Notes (Signed)
CSN: [id number]     Arrival date & time 05/13/14  0636 History   First MD Initiated Contact with Patient 05/13/14 [phone]     Chief Complaint  Patient presents with  . Flank Pain     (Consider location/radiation/quality/duration/timing/severity/associated sxs/prior Treatment) HPI Comments: Patient presents today with a chief complaint of right flank pain.  She reports acute onset of sharp pain at 8 PM last night.  Pain has been constant since that time, but at times becomes worse.  Pain does not radiate.  She was also having right flank pain two days ago and saw her PCP for this pain.  At that time she was diagnosed with Pyelonephritis and started on Cipro.  Although urine at that time did not show leukocytes.  She has been taking Hydrocodone for pain with mild relief.  She reports associated nausea.  Denies vomiting, diarrhea, abdominal pain, fever, chills, or vaginal discharge.  PMH significant for Ovarian Cyst and PID.  She has also had an Appendectomy in the past.  No prior history of Kidney stones.  Patient is a 34 y.o. female presenting with flank pain. The history is provided by the patient.  Flank Pain    Past Medical History  Diagnosis Date  . UTI (urinary tract infection)     frequent during pregnancy  . Chlamydia   . PID (pelvic inflammatory disease)   . Anemia     during pregnancy  . Allergy   . Ovarian cyst     history of  . Contraception management     6 pregnancies, 1 SAB, 3 TAB, 2 live births; failed [place]  and Depo Provera prior due to adverse effects  . Preterm labor    Past Surgical History  Procedure Laterality Date  . Dilation and curettage of uterus    . Tonsillectomy    . Appendectomy  2002  . Hernia repair      umbilical   Family History  Problem Relation Age of Onset  . Depression    . Diabetes      paternal side  . Hypertension Paternal Grandmother   . Diabetes Paternal Grandmother   . Cancer Mother 30    cervical  . Hypertension Mother   .  Thyroid disease Mother   . Stroke Maternal Aunt   . Cancer Paternal Grandfather     colon  . Heart disease Neg Hx    History  Substance Use Topics  . Smoking status: Former Smoker    Quit date: 05/06/2005  . Smokeless tobacco: Never Used  . Alcohol Use: No   OB History    Gravida Para Term Preterm AB TAB SAB Ectopic Multiple Living   Review of Systems  Genitourinary: Positive for flank pain.  All other systems reviewed and are negative.     Allergies  Review of patient's allergies indicates no known allergies.  Home Medications   Prior to Admission medications   Medication Sig Start Date End Date Taking? Authorizing Provider  acetaminophen (TYLENOL) 325 MG tablet Take 325 mg by mouth every 6 (six) hours as needed for pain (pain).   Yes Historical Provider, MD  ciprofloxacin (CIPRO) 500 MG tablet Take 1 tablet (500 mg total) by mouth 2 (two) times daily. 05/11/14  Yes Kermit Balo Tysinger, PA-C  ibuprofen (ADVIL,MOTRIN) 200 MG tablet Take 400 mg by mouth every 6 (six) hours as needed for moderate pain.   Yes  Historical Provider, MD  ondansetron (ZOFRAN) 4 MG tablet Take 1 tablet (4 mg total) by mouth every 8 (eight) hours as needed for nausea or vomiting. 05/11/14  Yes David S Tysinger, PA-C   BP 110/67 mmHg  Pulse 88  Temp(Src) 98.1 F (36.7 C) (Oral)  Resp 22  Ht  (1.727 m)  Wt 187 lb (84.823 kg)  BMI 28.44 kg/m2  SpO2 100% Physical Exam  Constitutional: She appears well-developed and well-nourished.  HENT:  Head: Normocephalic and atraumatic.  Mouth/Throat: Oropharynx is clear and moist.  Neck: Normal range of motion. Neck supple.  Cardiovascular: Normal rate, regular rhythm and normal heart sounds.   Pulmonary/Chest: Effort normal and breath sounds normal.  Abdominal: Soft. Bowel sounds are normal. She exhibits no distension and no mass. There is no tenderness. There is CVA tenderness. There is no rebound and no guarding.  Right CVA  tenderness  Genitourinary: Cervix exhibits no motion tenderness. Right adnexum displays no mass, no tenderness and no fullness. Left adnexum displays no mass, no tenderness and no fullness.  Yellowish colored discharge in the vaginal vault  Neurological: She is alert.  Skin: Skin is warm and dry.  Psychiatric: She has a normal mood and affect.  Nursing note and vitals reviewed.   ED Course  Procedures (including critical care time) Labs Review Labs Reviewed  WET PREP, GENITAL - Abnormal; Notable for the following:    WBC, Wet Prep HPF POC TOO NUMEROUS TO COUNT (*)    All other components within normal limits  URINALYSIS, ROUTINE W REFLEX MICROSCOPIC - Abnormal; Notable for the following:    APPearance CLOUDY (*)    Hgb urine dipstick MODERATE (*)    Leukocytes, UA SMALL (*)    All other components within normal limits  CBC WITH DIFFERENTIAL - Abnormal; Notable for the following:    Lymphs Abs 4.2 (*)    All other components within normal limits  BASIC METABOLIC PANEL - Abnormal; Notable for the following:    GFR calc non Af Amer 85 (*)    All other components within normal limits  URINE MICROSCOPIC-ADD ON - Abnormal; Notable for the following:    Squamous Epithelial / LPF MANY (*)    Bacteria, UA FEW (*)    All other components within normal limits  URINE CULTURE  GC/CHLAMYDIA PROBE AMP  POC URINE PREG, ED    Imaging Review Ct Renal Stone Study  05/13/2014   CLINICAL DATA:  34 year old female with acute right flank pain. Initial encounter.  EXAM: CT ABDOMEN AND PELVIS WITHOUT CONTRAST  TECHNIQUE: Multidetector CT imaging of the abdomen and pelvis was performed following the standard protocol without IV contrast.  COMPARISON:  Ob ultrasound 06/09/2012 and earlier.  FINDINGS: Negative lung bases.  No pericardial or pleural effusion.  No osseous abnormality identified.  Evidence of a small volume of fluid in the cul-de-sac with a hematocrit level (series 2, image 73). IUD within the  uterus. Negative non contrast appearance of both adnexal a. diminutive bladder. Gas distended, negative rectum. Sigmoid colon within normal limits.  There are scattered mesenteric and retroperitoneal surgical clips in the lower abdomen, and tracking toward the cecum where it appears changes of appendectomy also are present. Negative left colon, transverse colon (retained stool), right colon and cecum (retained stool). There is loculated material in the terminal ileum which otherwise appears normal. Otherwise decompressed small bowel loops throughout the abdomen. Decompressed stomach and duodenum.  Non contrast liver, gallbladder, spleen, pancreas, and adrenal glands  are within normal limits. No abdominal free fluid.  No right nephrolithiasis, hydronephrosis or perinephric stranding. Negative course of the right ureter, other than proximity to the right retroperitoneal surgical clips. No left nephrolithiasis, hydronephrosis or perinephric stranding. Negative course of the left ureter. There are small pelvic phleboliths more numerous on the left. No lymphadenopathy.  IMPRESSION: 1. Small volume of fluid in the cul-de-sac with a hematocrit level. In the absence of recent trauma favor related to ruptured/hemorrhagic ovarian cyst in this age group. IUD incidentally noted. 2. Previous surgery to the lower abdominal mesentery and right retroperitoneum of unclear etiology. There appears to be coincident appendectomy. No dilated or inflamed bowel. 3. No urologic calculus or obstructive uropathy.   Electronically Signed   By: Augusto GambleLee  Hall M.D.   On: 05/13/2014 08:28     EKG Interpretation None     8:45 AM Reassessed patient.  She reports that her pain and nausea have improved.  Discussed CT results.  She is now requesting a pelvic exam.  She states that she has had PID in the past.   9:45 AM Reassessed patient.  Pain controlled at this time.   MDM   Final diagnoses:  Right flank pain   Patient presents today with  right flank pain.  She is afebrile.  She reports that she was seen by Urgent Care 2 days ago and diagnosed with Pyelonephritis and given Rx for Cipro.  UA is not consistent with Pyelonephritis and did show some hemoglobin.  Urine cultured.  CT renal stone study ordered to rule out Kidney Stone.  CT is negative for stone, but did show possible ruptured ovarian cyst.  Patient requesting pelvic exam.  No adnexal tenderness or CMT on exam.  Wet prep negative, but did show TNTC WBC's.  GC/Chlamydia pending.  Therefore, patient given Rocephin and Azithromycin prophylactically in the ED.  Pain and nausea controlled at time of discharge.  Patient tolerating PO liquids.  Feel that the patient is stable for discharge.  Return precautions given.    Santiago GladHeather Effa Yarrow, PA-C 05/16/14 1604  Santiago GladHeather Ziaire Bieser, PA-C 05/16/14 1608  Purvis SheffieldForrest Harrison, MD 05/16/14 [phone]

## 2014-05-13 NOTE — ED Notes (Signed)
Heather, PA-C, at the bedside.  

## 2014-05-14 LAB — URINE CULTURE: Colony Count: 70000

## 2014-05-15 ENCOUNTER — Emergency Department (HOSPITAL_COMMUNITY): Payer: 59

## 2014-05-15 ENCOUNTER — Ambulatory Visit: Payer: 59

## 2014-05-15 ENCOUNTER — Emergency Department (HOSPITAL_COMMUNITY)
Admission: EM | Admit: 2014-05-15 | Discharge: 2014-05-15 | Disposition: A | Discharge status: Home / Self Care | Payer: 59 | Attending: Emergency Medicine | Admitting: Emergency Medicine

## 2014-05-15 DIAGNOSIS — Z3202 Encounter for pregnancy test, result negative: Secondary | ICD-10-CM | POA: Diagnosis not present

## 2014-05-15 DIAGNOSIS — Z862 Personal history of diseases of the blood and blood-forming organs and certain disorders involving the immune mechanism: Secondary | ICD-10-CM | POA: Diagnosis not present

## 2014-05-15 DIAGNOSIS — N730 Acute parametritis and pelvic cellulitis: Secondary | ICD-10-CM

## 2014-05-15 DIAGNOSIS — R1030 Lower abdominal pain, unspecified: Secondary | ICD-10-CM | POA: Diagnosis present

## 2014-05-15 DIAGNOSIS — Z87440 Personal history of urinary (tract) infections: Secondary | ICD-10-CM | POA: Diagnosis not present

## 2014-05-15 DIAGNOSIS — Z8619 Personal history of other infectious and parasitic diseases: Secondary | ICD-10-CM | POA: Insufficient documentation

## 2014-05-15 DIAGNOSIS — Z87891 Personal history of nicotine dependence: Secondary | ICD-10-CM | POA: Diagnosis not present

## 2014-05-15 DIAGNOSIS — Z792 Long term (current) use of antibiotics: Secondary | ICD-10-CM | POA: Diagnosis not present

## 2014-05-15 DIAGNOSIS — R102 Pelvic and perineal pain: Secondary | ICD-10-CM

## 2014-05-15 LAB — CBC WITH DIFFERENTIAL/PLATELET
BASOS PCT: 0 % (ref 0–1)
Basophils Absolute: 0 10*3/uL (ref 0.0–0.1)
Eosinophils Absolute: 0.1 10*3/uL (ref 0.0–0.7)
Eosinophils Relative: 1 % (ref 0–5)
HCT: 40.8 % (ref 36.0–46.0)
Hemoglobin: 14.1 g/dL (ref 12.0–15.0)
LYMPHS PCT: 41 % (ref 12–46)
Lymphs Abs: 3.2 10*3/uL (ref 0.7–4.0)
MCH: 30.7 pg (ref 26.0–34.0)
MCHC: 34.6 g/dL (ref 30.0–36.0)
MCV: 88.7 fL (ref 78.0–100.0)
MONOS PCT: 6 % (ref 3–12)
Monocytes Absolute: 0.5 10*3/uL (ref 0.1–1.0)
NEUTROS ABS: 4 10*3/uL (ref 1.7–7.7)
NEUTROS PCT: 52 % (ref 43–77)
PLATELETS: 262 10*3/uL (ref 150–400)
RBC: 4.6 MIL/uL (ref 3.87–5.11)
RDW: 13.3 % (ref 11.5–15.5)
WBC: 7.8 10*3/uL (ref 4.0–10.5)

## 2014-05-15 LAB — WET PREP, GENITAL
Clue Cells Wet Prep HPF POC: NONE SEEN
Trich, Wet Prep: NONE SEEN

## 2014-05-15 LAB — COMPREHENSIVE METABOLIC PANEL
ALT: 23 U/L (ref 0–35)
ANION GAP: 9 (ref 5–15)
AST: 22 U/L (ref 0–37)
Albumin: 4.8 g/dL (ref 3.5–5.2)
Alkaline Phosphatase: 103 U/L (ref 39–117)
BUN: 7 mg/dL (ref 6–23)
CALCIUM: 9.9 mg/dL (ref 8.4–10.5)
CO2: 22 mmol/L (ref 19–32)
CREATININE: 0.99 mg/dL (ref 0.50–1.10)
Chloride: 105 mEq/L (ref 96–112)
GFR, EST AFRICAN AMERICAN: 87 mL/min — AB (ref >90)
GFR, EST NON AFRICAN AMERICAN: 75 mL/min — AB (ref >90)
Glucose, Bld: 94 mg/dL (ref 70–99)
POTASSIUM: 4.3 mmol/L (ref 3.5–5.1)
SODIUM: 136 mmol/L (ref 135–145)
TOTAL PROTEIN: 7.9 g/dL (ref 6.0–8.3)
Total Bilirubin: 0.8 mg/dL (ref 0.3–1.2)

## 2014-05-15 LAB — URINALYSIS, ROUTINE W REFLEX MICROSCOPIC
BILIRUBIN URINE: NEGATIVE
Glucose, UA: NEGATIVE mg/dL
Ketones, ur: NEGATIVE mg/dL
Leukocytes, UA: NEGATIVE
NITRITE: NEGATIVE
Protein, ur: NEGATIVE mg/dL
Specific Gravity, Urine: 1.006 (ref 1.005–1.030)
Urobilinogen, UA: 0.2 mg/dL (ref 0.0–1.0)
pH: 7 (ref 5.0–8.0)

## 2014-05-15 LAB — POC URINE PREG, ED: Preg Test, Ur: NEGATIVE

## 2014-05-15 LAB — LIPASE, BLOOD: Lipase: 38 U/L (ref 11–59)

## 2014-05-15 LAB — URINE MICROSCOPIC-ADD ON

## 2014-05-15 MED ORDER — ONDANSETRON 4 MG PO TBDP: ORAL_TABLET | ORAL | Status: DC

## 2014-05-15 MED ORDER — ONDANSETRON HCL 4 MG/2ML IJ SOLN
4.0000 mg | Freq: Once | INTRAMUSCULAR | Status: AC
  Administered 2014-05-15: 4 mg via INTRAVENOUS
  Filled 2014-05-15: qty 2

## 2014-05-15 MED ORDER — HYDROMORPHONE HCL 1 MG/ML IJ SOLN
1.0000 mg | Freq: Once | INTRAMUSCULAR | Status: AC
  Administered 2014-05-15: 1 mg via INTRAVENOUS
  Filled 2014-05-15: qty 1

## 2014-05-15 MED ORDER — METRONIDAZOLE 500 MG PO TABS
2000.0000 mg | ORAL_TABLET | Freq: Once | ORAL | Status: AC
  Administered 2014-05-15: 2000 mg via ORAL
  Filled 2014-05-15: qty 4

## 2014-05-15 MED ORDER — OXYCODONE-ACETAMINOPHEN 5-325 MG PO TABS: 1.0000 | ORAL_TABLET | ORAL | Status: DC | PRN

## 2014-05-15 MED ORDER — DOXYCYCLINE HYCLATE 100 MG PO CAPS: 100.0000 mg | ORAL_CAPSULE | Freq: Two times a day (BID) | ORAL | Status: DC

## 2014-05-15 MED ORDER — ONDANSETRON 4 MG PO TBDP
8.0000 mg | ORAL_TABLET | Freq: Once | ORAL | Status: AC
  Administered 2014-05-15: 8 mg via ORAL
  Filled 2014-05-15: qty 2

## 2014-05-15 MED ORDER — MORPHINE SULFATE 4 MG/ML IJ SOLN
4.0000 mg | Freq: Once | INTRAMUSCULAR | Status: AC
  Administered 2014-05-15: 4 mg via INTRAVENOUS
  Filled 2014-05-15: qty 1

## 2014-05-15 MED ORDER — DOXYCYCLINE HYCLATE 100 MG IV SOLR
100.0000 mg | Freq: Once | INTRAVENOUS | Status: AC
  Administered 2014-05-15: 100 mg via INTRAVENOUS
  Filled 2014-05-15: qty 100

## 2014-05-15 NOTE — Discharge Instructions (Signed)
1. Medications: zofran, Percocet, doxycycline, usual home medications 2. Treatment: rest, drink plenty of fluids, advance diet slowly 3. Follow Up: Please followup with your OB/GYN tomorrow for discussion of your diagnoses and further evaluation after today's visit; if you do not have a primary care doctor use the resource guide provided to find one; Please return to the ER for persistent vomiting, high fevers or worsening symptoms  Pelvic Inflammatory Disease Pelvic inflammatory disease (PID) refers to an infection in some or all of the female organs. The infection can be in the uterus, ovaries, fallopian tubes, or the surrounding tissues in the pelvis. PID can cause abdominal or pelvic pain that comes on suddenly (acute pelvic pain). PID is a serious infection because it can lead to lasting (chronic) pelvic pain or the inability to have children (infertile).  CAUSES  The infection is often caused by the normal bacteria found in the vaginal tissues. PID may also be caused by an infection that is spread during sexual contact. PID can also occur following:   The birth of a baby.   A miscarriage.   An abortion.   Major pelvic surgery.   The use of an intrauterine device (IUD).   A sexual assault.  RISK FACTORS Certain factors can put a person at higher risk for PID, such as:  Being younger than 25 years.  Being sexually active at [place] age.  Usingnonbarrier contraception.  Havingmultiple sexual partners.  Having sex with someone who has symptoms of a genital infection.  Using oral contraception. Other times, certain behaviors can increase the possibility of getting PID, such as:  Having sex during your period.  Using a vaginal douche.  Having an intrauterine device (IUD) in place. SYMPTOMS   Abdominal or pelvic pain.   Fever.   Chills.   Abnormal vaginal discharge.  Abnormal uterine bleeding.   Unusual pain shortly after finishing your  period. DIAGNOSIS  Your caregiver will choose some of the following methods to make a diagnosis, such as:   Performinga physical exam and history. A pelvic exam typically reveals a very tender uterus and surrounding pelvis.   Ordering laboratory tests including a pregnancy test, blood tests, and urine test.  Orderingcultures of the vagina and cervix to check for a sexually transmitted infection (STI).  Performing an ultrasound.   Performing a laparoscopic procedure to look inside the pelvis.  TREATMENT   Antibiotic medicines may be prescribed and taken by mouth.   Sexual partners may be treated when the infection is caused by a sexually transmitted disease (STD).   Hospitalization may be needed to give antibiotics intravenously.  Surgery may be needed, but this is rare. It may take weeks until you are completely well. If you are diagnosed with PID, you should also be checked for human immunodeficiency virus (HIV). HOME CARE INSTRUCTIONS   If given, take your antibiotics as directed. Finish the medicine even if you start to feel better.   Only take over-the-counter or prescription medicines for pain, discomfort, or fever as directed by your caregiver.   Do not have sexual intercourse until treatment is completed or as directed by your caregiver. If PID is confirmed, your recent sexual partner(s) will need treatment.   Keep your follow-up appointments. SEEK MEDICAL CARE IF:   You have increased or abnormal vaginal discharge.   You need prescription medicine for your pain.   You vomit.   You cannot take your medicines.   Your partner has an STD.  SEEK IMMEDIATE MEDICAL CARE  IF:   You have a fever.   You have increased abdominal or pelvic pain.   You have chills.   You have pain when you urinate.   You are not better after 72 hours following treatment.  MAKE SURE YOU:   Understand these instructions.  Will watch your condition.  Will get  help right away if you are not doing well or get worse. Document Released: 04/22/2005 Document Revised: 08/17/2012 Document Reviewed: 04/18/2011 [facility] Patient Information 2015 [place], [place]. This information is not intended to replace advice given to you by your health care provider. Make sure you discuss any questions you have with your health care provider.

## 2014-05-15 NOTE — ED Notes (Signed)
Pt was seen for right sided flank pain last week diagnosed at ruptured cyst this morning pain moved into suprapubic area. Decreased appetite worse after eating per pt.

## 2014-05-15 NOTE — ED Provider Notes (Signed)
CSN: [id number]     Arrival date & time 05/15/14  1213 History   First MD Initiated Contact with Patient 05/15/14 1605     Chief Complaint  Patient presents with  . Abdominal Pain     (Consider location/radiation/quality/duration/timing/severity/associated sxs/prior Treatment) Patient is a 34 y.o. female presenting with abdominal pain. The history is provided by the patient and medical records. No language interpreter was used.  Abdominal Pain Associated symptoms: nausea   Associated symptoms: no chest pain, no constipation, no cough, no diarrhea, no dysuria, no fatigue, no fever, no hematuria, no shortness of breath and no vomiting      Jennifer Patterson is a 34 y.o. female  with a hx of PID, recurrent UTI, anemia, ovarian cyst presents to the Emergency Department complaining of gradual, persistent, progressively worsening lower abdominal pain onset last night after dinner.  Pt reports taking pain medication after d/c and feeling better until last night until her pain returned now in the bilateral lower back in addition to RLQ and LLQ abd pain, as opposed to just the right lower back.  Associated symptoms include severe nausea without vomiting and decreased appetite.  Pt reports h/x of ruptured cyst and PID and today's symptoms are similar to previous episodes of PID.  Patient reports she has an IUD in place that was placed in July 2014. She reports previous episode of PID was with IUD in place and was of unknown etiology. She denies history of STD. Pt reports subjective chills without measured fever.  No aggravating or alleviating symptoms.  No fever, headache, neck pain, chest pain, shortness of breath, vomiting, diarrhea, weakness, dizziness, syncope, dysuria.   Past Medical History  Diagnosis Date  . UTI (urinary tract infection)     frequent during pregnancy  . Chlamydia   . PID (pelvic inflammatory disease)   . Anemia     during pregnancy  . Allergy   . Ovarian cyst     history of   . Contraception management     6 pregnancies, 1 SAB, 3 TAB, 2 live births; failed [place]  and Depo Provera prior due to adverse effects  . Preterm labor    Past Surgical History  Procedure Laterality Date  . Dilation and curettage of uterus    . Tonsillectomy    . Appendectomy  2002  . Hernia repair      umbilical   Family History  Problem Relation Age of Onset  . Depression    . Diabetes      paternal side  . Hypertension Paternal Grandmother   . Diabetes Paternal Grandmother   . Cancer Mother 30    cervical  . Hypertension Mother   . Thyroid disease Mother   . Stroke Maternal Aunt   . Cancer Paternal Grandfather     colon  . Heart disease Neg Hx    History  Substance Use Topics  . Smoking status: Former Smoker    Quit date: 05/06/2005  . Smokeless tobacco: Never Used  . Alcohol Use: No   OB History    Gravida Para Term Preterm AB TAB SAB Ectopic Multiple Living   Review of Systems  Constitutional: Negative for fever, diaphoresis, appetite change, fatigue and unexpected weight change.  HENT: Negative for mouth sores and trouble swallowing.   Respiratory: Negative for cough, chest tightness, shortness of breath, wheezing and stridor.   Cardiovascular: Negative for chest pain and  palpitations.  Gastrointestinal: Positive for nausea and abdominal pain. Negative for vomiting, diarrhea, constipation, blood in stool, abdominal distention and rectal pain.  Genitourinary: Negative for dysuria, urgency, frequency, hematuria, flank pain and difficulty urinating.  Musculoskeletal: Negative for back pain, neck pain and neck stiffness.  Skin: Negative for rash.  Neurological: Negative for weakness.  Hematological: Negative for adenopathy.  Psychiatric/Behavioral: Negative for confusion.  All other systems reviewed and are negative.     Allergies  Review of patient's allergies indicates no known allergies.  Home Medications   Prior to Admission  medications   Medication Sig Start Date End Date Taking? Authorizing Provider  ciprofloxacin (CIPRO) 500 MG tablet Take 1 tablet (500 mg total) by mouth 2 (two) times daily. 05/11/14  Yes Kermit Balo Tysinger, PA-C  HYDROcodone-acetaminophen (NORCO/VICODIN) 5-325 MG per tablet Take 1-2 tablets by mouth every 6 (six) hours as needed. 05/13/14  Yes Heather Laisure, PA-C  ondansetron (ZOFRAN) 4 MG tablet Take 1 tablet (4 mg total) by mouth every 6 (six) hours. 05/13/14  Yes Heather Laisure, PA-C  doxycycline (VIBRAMYCIN) 100 MG capsule Take 1 capsule (100 mg total) by mouth 2 (two) times daily. 05/15/14   Tami Barren, PA-C  ondansetron (ZOFRAN ODT) 4 MG disintegrating tablet  ODT q4 hours prn nausea/vomit 05/15/14   Kina Shiffman, PA-C  oxyCODONE-acetaminophen (PERCOCET) 5-325 MG per tablet Take 1-2 tablets by mouth every 4 (four) hours as needed. 05/15/14   Maddilyn Campus, PA-C   BP 114/69 mmHg  Pulse 91  Temp(Src) 98.2 F (36.8 C)  Resp 16  Ht  (1.727 m)  Wt 187 lb (84.823 kg)  BMI 28.44 kg/m2  SpO2 97%  LMP 04/24/2014 Physical Exam  Constitutional: She appears well-developed and well-nourished. No distress.  HENT:  Head: Normocephalic and atraumatic.  Mouth/Throat: Oropharynx is clear and moist.  Eyes: Conjunctivae are normal. No scleral icterus.  Neck: Normal range of motion.  Cardiovascular: Normal rate, regular rhythm, normal heart sounds and intact distal pulses.   No murmur heard. Pulmonary/Chest: Effort normal and breath sounds normal. No respiratory distress. She has no wheezes.  Abdominal: Soft. Bowel sounds are normal. She exhibits no distension and no mass. There is tenderness in the right lower quadrant, suprapubic area and left lower quadrant. There is guarding. There is no rebound and no CVA tenderness. Hernia confirmed negative in the right inguinal area and confirmed negative in the left inguinal area.  Right lower quadrant, left lower quadrant and  suprapubic abdominal pain with guarding At no rebound or peritoneal signs No CVA tenderness  Genitourinary: Uterus normal. No labial fusion. There is no rash, tenderness or lesion on the right labia. There is no rash, tenderness or lesion on the left labia. Uterus is not deviated, not enlarged, not fixed and not tender. Cervix exhibits motion tenderness. Cervix exhibits no discharge and no friability. Right adnexum displays tenderness. Right adnexum displays no mass and no fullness. Left adnexum displays tenderness. Left adnexum displays no mass and no fullness. No erythema, tenderness or bleeding in the vagina. No foreign body around the vagina. No signs of injury around the vagina. Vaginal discharge ( Thick, green, copious amounts) found.  Cervical motion tenderness along with right and left adnexal tenderness Positive chandelier sign IUD strings visible  Musculoskeletal: Normal range of motion. She exhibits no edema.  Lymphadenopathy:       Right: No inguinal adenopathy present.       Left: No inguinal adenopathy present.  Neurological: She is alert. She exhibits  normal muscle tone. Coordination normal.  Skin: Skin is warm and dry. She is not diaphoretic. No erythema.  Psychiatric: She has a normal mood and affect.  Nursing note and vitals reviewed.   ED Course  Procedures (including critical care time) Labs Review Labs Reviewed  WET PREP, GENITAL - Abnormal; Notable for the following:    Yeast Wet Prep HPF POC FEW (*)    WBC, Wet Prep HPF POC MODERATE (*)    All other components within normal limits  COMPREHENSIVE METABOLIC PANEL - Abnormal; Notable for the following:    GFR calc non Af Amer 75 (*)    GFR calc Af Amer 87 (*)    All other components within normal limits  URINALYSIS, ROUTINE W REFLEX MICROSCOPIC - Abnormal; Notable for the following:    Hgb urine dipstick TRACE (*)    All other components within normal limits  URINE MICROSCOPIC-ADD ON - Abnormal; Notable for the  following:    Squamous Epithelial / LPF MANY (*)    All other components within normal limits  CBC WITH DIFFERENTIAL  LIPASE, BLOOD  POC URINE PREG, ED    Imaging Review [place] Transvaginal Non-ob  05/15/2014   CLINICAL DATA:  Right-sided pelvic pain.  EXAM: TRANSABDOMINAL AND TRANSVAGINAL ULTRASOUND OF PELVIS  DOPPLER ULTRASOUND OF OVARIES  TECHNIQUE: Both transabdominal and transvaginal ultrasound examinations of the pelvis were performed. Transabdominal technique was performed for global imaging of the pelvis including uterus, ovaries, adnexal regions, and pelvic cul-de-sac.  It was necessary to proceed with endovaginal exam following the transabdominal exam to visualize the endometrium and ovaries. Color and duplex Doppler ultrasound was utilized to evaluate blood flow to the ovaries.  COMPARISON:  CT 05/13/2014  FINDINGS: Uterus  Measurements: 7.5 x 4.0 x 5.1 cm. No fibroids or other mass visualized.  Endometrium  Thickness: 5.3 mm. Shadowing intrauterine device is appropriately positioned. No endometrial fluid.  Right ovary  Measurements: 2.7 x 1.6 x 2.0 cm. Normal appearance/no adnexal mass. Follicular cysts are seen, largest measuring 1.1 x 0.9 x 1.0 cm. No hemorrhagic cyst or solid lesion. There is normal blood flow.  Left ovary  Measurements: 3.8 x 2.2 x 2.8 cm. There is a complex 1.8 x 2.0 x 1.4 cm cyst with low-level internal echoes and peripheral blood flow. Normal flow is seen to the surrounding ovarian parenchyma.  Pulsed Doppler evaluation of both ovaries demonstrates normal low-resistance arterial and venous waveforms.  Other findings  No free fluid.  No adnexal mass.  IMPRESSION: 1. Complex left ovarian cyst measuring 2.5 cm, likely a corpus luteal cyst. 2. Normal blood flow to both ovaries.  No ovarian torsion. 3. Intrauterine device appropriately positioned in the endometrium.   Electronically Signed   By: Rubye OaksMelanie  Ehinger M.D.   On: 05/15/2014 17:49   [place] Pelvis Complete  05/15/2014    CLINICAL DATA:  Right-sided pelvic pain.  EXAM: TRANSABDOMINAL AND TRANSVAGINAL ULTRASOUND OF PELVIS  DOPPLER ULTRASOUND OF OVARIES  TECHNIQUE: Both transabdominal and transvaginal ultrasound examinations of the pelvis were performed. Transabdominal technique was performed for global imaging of the pelvis including uterus, ovaries, adnexal regions, and pelvic cul-de-sac.  It was necessary to proceed with endovaginal exam following the transabdominal exam to visualize the endometrium and ovaries. Color and duplex Doppler ultrasound was utilized to evaluate blood flow to the ovaries.  COMPARISON:  CT 05/13/2014  FINDINGS: Uterus  Measurements: 7.5 x 4.0 x 5.1 cm. No fibroids or other mass visualized.  Endometrium  Thickness: 5.3 mm. Shadowing  intrauterine device is appropriately positioned. No endometrial fluid.  Right ovary  Measurements: 2.7 x 1.6 x 2.0 cm. Normal appearance/no adnexal mass. Follicular cysts are seen, largest measuring 1.1 x 0.9 x 1.0 cm. No hemorrhagic cyst or solid lesion. There is normal blood flow.  Left ovary  Measurements: 3.8 x 2.2 x 2.8 cm. There is a complex 1.8 x 2.0 x 1.4 cm cyst with low-level internal echoes and peripheral blood flow. Normal flow is seen to the surrounding ovarian parenchyma.  Pulsed Doppler evaluation of both ovaries demonstrates normal low-resistance arterial and venous waveforms.  Other findings  No free fluid.  No adnexal mass.  IMPRESSION: 1. Complex left ovarian cyst measuring 2.5 cm, likely a corpus luteal cyst. 2. Normal blood flow to both ovaries.  No ovarian torsion. 3. Intrauterine device appropriately positioned in the endometrium.   Electronically Signed   By: Rubye Oaks M.D.   On: 05/15/2014 17:49   [place] Art/ven Flow Abd Pelv Doppler  05/15/2014   CLINICAL DATA:  Right-sided pelvic pain.  EXAM: TRANSABDOMINAL AND TRANSVAGINAL ULTRASOUND OF PELVIS  DOPPLER ULTRASOUND OF OVARIES  TECHNIQUE: Both transabdominal and transvaginal ultrasound examinations  of the pelvis were performed. Transabdominal technique was performed for global imaging of the pelvis including uterus, ovaries, adnexal regions, and pelvic cul-de-sac.  It was necessary to proceed with endovaginal exam following the transabdominal exam to visualize the endometrium and ovaries. Color and duplex Doppler ultrasound was utilized to evaluate blood flow to the ovaries.  COMPARISON:  CT 05/13/2014  FINDINGS: Uterus  Measurements: 7.5 x 4.0 x 5.1 cm. No fibroids or other mass visualized.  Endometrium  Thickness: 5.3 mm. Shadowing intrauterine device is appropriately positioned. No endometrial fluid.  Right ovary  Measurements: 2.7 x 1.6 x 2.0 cm. Normal appearance/no adnexal mass. Follicular cysts are seen, largest measuring 1.1 x 0.9 x 1.0 cm. No hemorrhagic cyst or solid lesion. There is normal blood flow.  Left ovary  Measurements: 3.8 x 2.2 x 2.8 cm. There is a complex 1.8 x 2.0 x 1.4 cm cyst with low-level internal echoes and peripheral blood flow. Normal flow is seen to the surrounding ovarian parenchyma.  Pulsed Doppler evaluation of both ovaries demonstrates normal low-resistance arterial and venous waveforms.  Other findings  No free fluid.  No adnexal mass.  IMPRESSION: 1. Complex left ovarian cyst measuring 2.5 cm, likely a corpus luteal cyst. 2. Normal blood flow to both ovaries.  No ovarian torsion. 3. Intrauterine device appropriately positioned in the endometrium.   Electronically Signed   By: Rubye Oaks M.D.   On: 05/15/2014 17:49     EKG Interpretation None      MDM   Final diagnoses:  Pelvic pain in female  PID (acute pelvic inflammatory disease)   Kingsley Herandez presents with lower abdominal pain. Patient with history of PID and symptoms similar to today. Patient evaluated 2 days ago and given azithromycin and Rocephin for possible STD. She was discharged home in good condition. She returned today with worsening symptoms. Patient uncomfortable, large amount of  vaginal discharge in the vaginal vault with cervical motion tenderness.  Concern for PID. Patient is afebrile, non-tachycardic without hypotension. She's tolerating by mouth without difficulty.  7:00PM Labs reassuring without leukocytosis. Urinalysis without evidence of urinary tract infection and wet prep with moderate white blood cells. Gonorrhea and Chlamydia cultures pending from 2 days ago however she was treated prophylactically for this.  CT scan 2 days ago with questionable ruptured ovarian cyst but no  other acute findings and no tubo-ovarian abscess. Transvaginal ultrasound today without evidence of torsion or tubo-ovarian abscess. Will treat for PID and plan for discharge home if she remains stable.  9:16 PM Patient alert, oriented, nontoxic and nonseptic appearing. She remains afebrile, non-tachycardic and without hypotension. She is tolerating by mouth here in the department without difficulty. Her pain is well controlled.  Patient is stable for outpatient treatment however she needs to see her OB/GYN tomorrow for further evaluation. She's been given Flagyl and doxycycline here in the emergency department and will go home with doxycycline. She's to return for fevers, vomiting or worsening symptoms.  She reports she can follow-up with OB GYN tomorrow.  I have personally reviewed patient's vitals, nursing note and any pertinent labs or imaging.  I performed an undressed physical exam.    It has been determined that no acute conditions requiring further emergency intervention are present at this time. The patient/guardian have been advised of the diagnosis and plan. I reviewed all labs and imaging including any potential incidental findings. We have discussed signs and symptoms that warrant return to the ED and they are listed in the discharge instructions.    Vital signs are stable at discharge.   BP 114/69 mmHg  Pulse 91  Temp(Src) 98.2 F (36.8 C)  Resp 16  Ht 5\' 8"  (1.727 m)  Wt  187 lb (84.823 kg)  BMI 28.44 kg/m2  SpO2 97%  LMP 04/24/2014        Dierdre Forth, PA-C 05/15/14 2121  Juliet Rude. Rubin Payor, MD 05/16/14 [phone]

## 2014-05-17 LAB — GC/CHLAMYDIA PROBE AMP: CT Probe RNA: UNDETERMINED

## 2014-05-19 NOTE — Progress Notes (Signed)
LM to [facility] 

## 2014-05-19 NOTE — Progress Notes (Signed)
LM to CB

## 2014-06-14 ENCOUNTER — Other Ambulatory Visit: Payer: Self-pay | Admitting: Obstetrics and Gynecology

## 2014-07-07 ENCOUNTER — Other Ambulatory Visit: Payer: Self-pay | Admitting: Medical

## 2014-07-07 ENCOUNTER — Telehealth: Payer: Self-pay | Admitting: Internal Medicine

## 2014-07-07 MED ORDER — OSELTAMIVIR PHOSPHATE 75 MG PO CAPS: 75.0000 mg | ORAL_CAPSULE | Freq: Every day | ORAL | Status: DC

## 2014-07-07 NOTE — Telephone Encounter (Signed)
Tamiflu prophylaxis sent. 

## 2014-07-07 NOTE — Telephone Encounter (Signed)
Patient informed and verbalized understanding

## 2014-07-07 NOTE — Telephone Encounter (Signed)
Pt called stating that all 3 of his kids have the flu and she has been having symptoms starting today and would like a rx for tamiflu before it gets worse. [facility] [address]

## 2014-10-28 ENCOUNTER — Ambulatory Visit (INDEPENDENT_AMBULATORY_CARE_PROVIDER_SITE_OTHER): Payer: 59 | Admitting: Family Medicine

## 2014-10-28 ENCOUNTER — Encounter: Payer: Self-pay | Admitting: Family Medicine

## 2014-10-28 VITALS — BP 112/70 | HR 70 | Temp 99.9°F | Wt 192.0 lb

## 2014-10-28 DIAGNOSIS — J029 Acute pharyngitis, unspecified: Secondary | ICD-10-CM | POA: Diagnosis not present

## 2014-10-28 DIAGNOSIS — R635 Abnormal weight gain: Secondary | ICD-10-CM | POA: Diagnosis not present

## 2014-10-28 LAB — POCT RAPID STREP A (OFFICE): Rapid Strep A Screen: NEGATIVE

## 2014-10-28 NOTE — Progress Notes (Signed)
   Subjective:    Patient ID: Jennifer Patterson, female    DOB: [birth year], 34 y.o.   MRN: [id number]  HPI She complains of a one-day history of left-sided or throat. No fever, chills, cough or congestion. She also states that she has had a significant weight gain up to 12 pounds in the last 4 days. She is now post menstrual and has noted a slight improvement.  Review of Systems     Objective:   Physical Exam Alert and in no distress. Tympanic membranes and canals are normal. Pharyngeal area is normal. Neck is supple without adenopathy or thyromegaly. Cardiac exam shows a regular sinus rhythm without murmurs or gallops. Lungs are clear to auscultation. Lower extremities show .5+ pitting edemaWith normal skin.       Assessment & Plan:  Sore throat - Plan: POCT rapid strep A  Weight gain Recommend supportive care for the sore throat. Explained that the weight gain could easily be related to hormonal fluctuation.

## 2016-07-22 ENCOUNTER — Emergency Department (HOSPITAL_COMMUNITY): Payer: 59

## 2016-07-22 ENCOUNTER — Emergency Department (HOSPITAL_COMMUNITY)
Admission: EM | Admit: 2016-07-22 | Discharge: 2016-07-22 | Disposition: A | Discharge status: Home / Self Care | Payer: 59 | Attending: Emergency Medicine | Admitting: Emergency Medicine

## 2016-07-22 ENCOUNTER — Encounter (HOSPITAL_COMMUNITY): Payer: Self-pay | Admitting: Emergency Medicine

## 2016-07-22 DIAGNOSIS — N739 Female pelvic inflammatory disease, unspecified: Secondary | ICD-10-CM | POA: Insufficient documentation

## 2016-07-22 DIAGNOSIS — R1030 Lower abdominal pain, unspecified: Secondary | ICD-10-CM | POA: Diagnosis present

## 2016-07-22 DIAGNOSIS — Z87891 Personal history of nicotine dependence: Secondary | ICD-10-CM | POA: Diagnosis not present

## 2016-07-22 DIAGNOSIS — R102 Pelvic and perineal pain: Secondary | ICD-10-CM

## 2016-07-22 DIAGNOSIS — N730 Acute parametritis and pelvic cellulitis: Secondary | ICD-10-CM

## 2016-07-22 LAB — URINALYSIS, ROUTINE W REFLEX MICROSCOPIC
BACTERIA UA: NONE SEEN
Bilirubin Urine: NEGATIVE
Glucose, UA: NEGATIVE mg/dL
Ketones, ur: NEGATIVE mg/dL
LEUKOCYTES UA: NEGATIVE
NITRITE: NEGATIVE
Protein, ur: NEGATIVE mg/dL
SPECIFIC GRAVITY, URINE: 1.009 (ref 1.005–1.030)
pH: 7 (ref 5.0–8.0)

## 2016-07-22 LAB — I-STAT BETA HCG BLOOD, ED (MC, WL, AP ONLY)

## 2016-07-22 LAB — LIPASE, BLOOD: Lipase: 17 U/L (ref 11–51)

## 2016-07-22 LAB — COMPREHENSIVE METABOLIC PANEL
ALBUMIN: 3.8 g/dL (ref 3.5–5.0)
ALT: 23 U/L (ref 14–54)
AST: 22 U/L (ref 15–41)
Alkaline Phosphatase: 109 U/L (ref 38–126)
Anion gap: 10 (ref 5–15)
BUN: 6 mg/dL (ref 6–20)
CALCIUM: 9.1 mg/dL (ref 8.9–10.3)
CO2: 26 mmol/L (ref 22–32)
Chloride: 104 mmol/L (ref 101–111)
Creatinine, Ser: 0.77 mg/dL (ref 0.44–1.00)
Glucose, Bld: 94 mg/dL (ref 65–99)
Potassium: 4.2 mmol/L (ref 3.5–5.1)
Sodium: 140 mmol/L (ref 135–145)
Total Bilirubin: 0.7 mg/dL (ref 0.3–1.2)
Total Protein: 6.7 g/dL (ref 6.5–8.1)

## 2016-07-22 LAB — CBC
HEMATOCRIT: 37.2 % (ref 36.0–46.0)
Hemoglobin: 12.5 g/dL (ref 12.0–15.0)
MCH: 30.9 pg (ref 26.0–34.0)
MCHC: 33.6 g/dL (ref 30.0–36.0)
MCV: 91.9 fL (ref 78.0–100.0)
Platelets: 271 10*3/uL (ref 150–400)
RBC: 4.05 MIL/uL (ref 3.87–5.11)
RDW: 13 % (ref 11.5–15.5)
WBC: 11.8 10*3/uL — AB (ref 4.0–10.5)

## 2016-07-22 LAB — WET PREP, GENITAL
Clue Cells Wet Prep HPF POC: NONE SEEN
Sperm: NONE SEEN
Trich, Wet Prep: NONE SEEN
Yeast Wet Prep HPF POC: NONE SEEN

## 2016-07-22 MED ORDER — LIDOCAINE HCL (PF) 1 % IJ SOLN
INTRAMUSCULAR | Status: AC
  Administered 2016-07-22: 0.9 mL
  Filled 2016-07-22: qty 5

## 2016-07-22 MED ORDER — DOXYCYCLINE HYCLATE 100 MG PO TABS
100.0000 mg | ORAL_TABLET | Freq: Once | ORAL | Status: AC
  Administered 2016-07-22: 100 mg via ORAL
  Filled 2016-07-22: qty 1

## 2016-07-22 MED ORDER — ONDANSETRON HCL 4 MG/2ML IJ SOLN
4.0000 mg | Freq: Once | INTRAMUSCULAR | Status: AC
  Administered 2016-07-22: 4 mg via INTRAVENOUS
  Filled 2016-07-22: qty 2

## 2016-07-22 MED ORDER — SODIUM CHLORIDE 0.9 % IV BOLUS (SEPSIS)
1000.0000 mL | Freq: Once | INTRAVENOUS | Status: AC
  Administered 2016-07-22: 1000 mL via INTRAVENOUS

## 2016-07-22 MED ORDER — PROMETHAZINE HCL 25 MG PO TABS
25.0000 mg | ORAL_TABLET | Freq: Once | ORAL | Status: AC
  Administered 2016-07-22: 25 mg via ORAL
  Filled 2016-07-22: qty 1

## 2016-07-22 MED ORDER — OXYCODONE-ACETAMINOPHEN 5-325 MG PO TABS: 1.0000 | ORAL_TABLET | Freq: Four times a day (QID) | ORAL | 0 refills | Status: DC | PRN

## 2016-07-22 MED ORDER — ACETAMINOPHEN 325 MG PO TABS
650.0000 mg | ORAL_TABLET | Freq: Once | ORAL | Status: AC
  Administered 2016-07-22: 650 mg via ORAL
  Filled 2016-07-22: qty 2

## 2016-07-22 MED ORDER — DOXYCYCLINE HYCLATE 100 MG PO CAPS: 100.0000 mg | ORAL_CAPSULE | Freq: Two times a day (BID) | ORAL | 0 refills | Status: DC

## 2016-07-22 MED ORDER — ONDANSETRON 4 MG PO TBDP: 4.0000 mg | ORAL_TABLET | Freq: Three times a day (TID) | ORAL | 0 refills | Status: DC | PRN

## 2016-07-22 MED ORDER — ONDANSETRON HCL 4 MG/2ML IJ SOLN
4.0000 mg | Freq: Once | INTRAMUSCULAR | Status: AC
Start: 2016-07-22 — End: 2016-07-22
  Administered 2016-07-22: 4 mg via INTRAVENOUS
  Filled 2016-07-22: qty 2

## 2016-07-22 MED ORDER — PROMETHAZINE HCL 25 MG PO TABS: 25.0000 mg | ORAL_TABLET | Freq: Four times a day (QID) | ORAL | 0 refills | Status: DC | PRN

## 2016-07-22 MED ORDER — HYDROMORPHONE HCL 1 MG/ML IJ SOLN
1.0000 mg | Freq: Once | INTRAMUSCULAR | Status: AC
  Administered 2016-07-22: 1 mg via INTRAVENOUS
  Filled 2016-07-22: qty 1

## 2016-07-22 MED ORDER — HYDROMORPHONE HCL 1 MG/ML IJ SOLN
1.0000 mg | Freq: Once | INTRAMUSCULAR | Status: AC
Start: 2016-07-22 — End: 2016-07-22
  Administered 2016-07-22: 1 mg via INTRAVENOUS
  Filled 2016-07-22: qty 1

## 2016-07-22 MED ORDER — CEFTRIAXONE SODIUM 250 MG IJ SOLR
250.0000 mg | Freq: Once | INTRAMUSCULAR | Status: AC
  Administered 2016-07-22: 250 mg via INTRAMUSCULAR
  Filled 2016-07-22: qty 250

## 2016-07-22 NOTE — ED Triage Notes (Signed)
Per pt, lower abdominal pain began this am, denies painful urination or frequency, unsure of last menstrual period.

## 2016-07-22 NOTE — Discharge Instructions (Signed)
Return to the ED with any concerns including fever/chills, vomiting and not able to keep down liquids or medications, decreased level of alertness/lethargy, or any other alarming symptoms

## 2016-07-22 NOTE — ED Notes (Signed)
Pt c/o headache, requesting tylenol. Dr. Karma GanjaLinker made aware

## 2016-07-22 NOTE — ED Notes (Signed)
PT made aware that she cannot drive or operate machinery for the rest of the day or while taking prescribed pain medicine (oxycodone). PT also made aware that phenergan may make her drowsy and impair driving ability.

## 2016-07-22 NOTE — ED Provider Notes (Signed)
[facility]-EMERGENCY DEPT Provider Note   CSN: [id number] Arrival date & time: 07/22/16  [id number]     History   Chief Complaint Chief Complaint  Patient presents with  . Abdominal Pain    HPI Jennifer Patterson is a 36 y.o. female.  HPI  Pt presenting with c/o lower abdominal pain, more on right side, that started this morning when she woke up to urinate.  Pain radiates up into the right flank. Pain was acute in onset.  No fever/chills.  No vomiting.  She is unable to find a comfortable position.  Pain worse with urination.  Last MP was approx 4 years ago- they are irregular.  She denies vaginal discharge, vaginal bleeding or hematuria.  No hx of kidney stones.  No symptoms yesterday.  She has not had any treatment prior to arrival.  She does have hx of ovarian cyst.  There are no other associated systemic symptoms, there are no other alleviating or modifying factors.   Past Medical History:  Diagnosis Date  . Allergy   . Anemia    during pregnancy  . Chlamydia   . Contraception management    6 pregnancies, 1 SAB, 3 TAB, 2 live births; failed [place]Merena  and Depo Provera prior due to adverse effects  . Ovarian cyst    history of  . PID (pelvic inflammatory disease)   . Preterm labor   . UTI (urinary tract infection)    frequent during pregnancy    Patient Active Problem List   Diagnosis Date Noted  . Obesity 11/03/2010  . Dizziness 09/10/2010    Past Surgical History:  Procedure Laterality Date  . APPENDECTOMY  2002  . DILATION AND CURETTAGE OF UTERUS    . HERNIA REPAIR     umbilical  . TONSILLECTOMY      OB History    Gravida Para Term Preterm AB Living   7 4 2 2 3 3    SAB TAB Ectopic Multiple Live Births   1 2     4        Home Medications    Prior to Admission medications   Medication Sig Start Date End Date Taking? Authorizing Provider  levonorgestrel (MIRENA, 52 MG,) 20 MCG/24HR IUD 1 each by Intrauterine route once.   Yes Historical Provider, MD  doxycycline  (VIBRAMYCIN) 100 MG capsule Take 1 capsule (100 mg total) by mouth 2 (two) times daily. 07/22/16   Jerelyn ScottMartha Linker, MD  ondansetron (ZOFRAN ODT) 4 MG disintegrating tablet Take 1 tablet (4 mg total) by mouth every 8 (eight) hours as needed. 07/22/16   Jerelyn ScottMartha Linker, MD  oxyCODONE-acetaminophen (PERCOCET/ROXICET) 5-325 MG tablet Take 1-2 tablets by mouth every 6 (six) hours as needed for severe pain. 07/22/16   Jerelyn ScottMartha Linker, MD  promethazine (PHENERGAN) 25 MG tablet Take 1 tablet (25 mg total) by mouth every 6 (six) hours as needed for nausea or vomiting. 07/22/16   Jerelyn ScottMartha Linker, MD    Family History Family History  Problem Relation Age of Onset  . Cancer Mother 30    cervical  . Hypertension Mother   . Thyroid disease Mother   . Depression    . Diabetes      paternal side  . Hypertension Paternal Grandmother   . Diabetes Paternal Grandmother   . Stroke Maternal Aunt   . Cancer Paternal Grandfather     colon  . Heart disease Neg Hx     Social History Social History  Substance Use Topics  . Smoking status:  Former Smoker    Quit date: 05/06/2005  . Smokeless tobacco: Never Used  . Alcohol use No     Allergies   Patient has no known allergies.   Review of Systems Review of Systems ROS reviewed and all otherwise negative except for mentioned in HPI  Physical Exam Updated Vital Signs BP (!) 105/56   Pulse 60   Temp 98.2 F (36.8 C) (Oral)   Resp 16   SpO2 99%  Vitals reviewed Physical Exam Physical Examination: General appearance - alert, well appearing, and in no distress Mental status - alert, oriented to person, place, and time Eyes - no conjunctival injection, no scleral icterus Mouth - mucous membranes moist, pharynx normal without lesions Neck - supple, no significant adenopathy Chest - clear to auscultation, no wheezes, rales or rhonchi, symmetric air entry Heart - normal rate, regular rhythm, normal S1, S2, no murmurs, rubs, clicks or gallops Abdomen - soft, ttp  in right pelvis, right CVA tenderness, nondistended, no masses or organomegaly Neurological - alert, oriented, normal speech,  Extremities - peripheral pulses normal, no pedal edema, no clubbing or cyanosis Skin - normal coloration and turgor, no rashes Pelvic- positive CMT, right adnexal tenderness, yellowish drainage discharge  ED Treatments / Results  Labs (all labs ordered are listed, but only abnormal results are displayed) Labs Reviewed  WET PREP, GENITAL - Abnormal; Notable for the following:       Result Value   WBC, Wet Prep HPF POC MANY (*)    All other components within normal limits  CBC - Abnormal; Notable for the following:    WBC 11.8 (*)    All other components within normal limits  URINALYSIS, ROUTINE W REFLEX MICROSCOPIC - Abnormal; Notable for the following:    Hgb urine dipstick SMALL (*)    Squamous Epithelial / LPF 0-5 (*)    All other components within normal limits  LIPASE, BLOOD  COMPREHENSIVE METABOLIC PANEL  HIV ANTIBODY (ROUTINE TESTING)  I-STAT BETA HCG BLOOD, ED ([facility], [facility], [facility] ONLY)  I-STAT BETA HCG BLOOD, ED ([facility], [facility], [facility] ONLY)  GC/CHLAMYDIA PROBE AMP ([place]) NOT AT [facility]    EKG  EKG Interpretation None       Radiology US Transvaginal Non-ob  Result Date: 07/22/2016 CLINICAL DATA:  Pelvic pain EXAM: TRANSABDOMINAL AND TRANSVAGINAL ULTRASOUND OF PELVIS DOPPLER ULTRASOUND OF OVARIES TECHNIQUE: Study was performed transabdominally to optimize pelvic field of view evaluation and transvaginally to optimize internal visceral architecture evaluation. Color and duplex Doppler ultrasound was utilized to evaluate blood flow to the ovaries. COMPARISON:  May 15, 2014 FINDINGS: Uterus Measurements: 8.7 x 4.1 x 5.9 cm. No fibroids or other mass visualized. Uterus is anteverted. There are several subcentimeter cervical nabothian cysts. Endometrium Thickness: 2 mm. Contour of the endometrium is smooth. There is an intrauterine device within the endometrium.  Small amount of fluid in the endometrium is also noted. Right ovary Measurements: 3.3 x 2.7 x 2.7 cm. Normal appearance/no adnexal mass. A physiologic follicle measuring 1.5 x 1.4 cm is present. Left ovary Measurements: 2.2 x 1.9 x 2.0 cm. Normal appearance/no adnexal mass. Pulsed Doppler evaluation of both ovaries demonstrates normal low-resistance arterial and venous waveforms. Other findings No abnormal free fluid. IMPRESSION: Within the endometrium, there is an intrauterine device and small amount of fluid. Endometrium is not thickened, and the contour of the endometrium is smooth. There are several subcentimeter cervical nabothian cysts. Study otherwise unremarkable. No ovarian torsion on either side. Electronically Signed   By:  Bretta Bang III M.D.   On: 07/22/2016 11:23   US Pelvis Complete  Result Date: 07/22/2016 CLINICAL DATA:  Pelvic pain EXAM: TRANSABDOMINAL AND TRANSVAGINAL ULTRASOUND OF PELVIS DOPPLER ULTRASOUND OF OVARIES TECHNIQUE: Study was performed transabdominally to optimize pelvic field of view evaluation and transvaginally to optimize internal visceral architecture evaluation. Color and duplex Doppler ultrasound was utilized to evaluate blood flow to the ovaries. COMPARISON:  May 15, 2014 FINDINGS: Uterus Measurements: 8.7 x 4.1 x 5.9 cm. No fibroids or other mass visualized. Uterus is anteverted. There are several subcentimeter cervical nabothian cysts. Endometrium Thickness: 2 mm. Contour of the endometrium is smooth. There is an intrauterine device within the endometrium. Small amount of fluid in the endometrium is also noted. Right ovary Measurements: 3.3 x 2.7 x 2.7 cm. Normal appearance/no adnexal mass. A physiologic follicle measuring 1.5 x 1.4 cm is present. Left ovary Measurements: 2.2 x 1.9 x 2.0 cm. Normal appearance/no adnexal mass. Pulsed Doppler evaluation of both ovaries demonstrates normal low-resistance arterial and venous waveforms. Other findings No abnormal  free fluid. IMPRESSION: Within the endometrium, there is an intrauterine device and small amount of fluid. Endometrium is not thickened, and the contour of the endometrium is smooth. There are several subcentimeter cervical nabothian cysts. Study otherwise unremarkable. No ovarian torsion on either side. Electronically Signed   By: Bretta Bang III M.D.   On: 07/22/2016 11:23   [place] Art/ven Flow Abd Pelv Doppler  Result Date: 07/22/2016 CLINICAL DATA:  Pelvic pain EXAM: TRANSABDOMINAL AND TRANSVAGINAL ULTRASOUND OF PELVIS DOPPLER ULTRASOUND OF OVARIES TECHNIQUE: Study was performed transabdominally to optimize pelvic field of view evaluation and transvaginally to optimize internal visceral architecture evaluation. Color and duplex Doppler ultrasound was utilized to evaluate blood flow to the ovaries. COMPARISON:  May 15, 2014 FINDINGS: Uterus Measurements: 8.7 x 4.1 x 5.9 cm. No fibroids or other mass visualized. Uterus is anteverted. There are several subcentimeter cervical nabothian cysts. Endometrium Thickness: 2 mm. Contour of the endometrium is smooth. There is an intrauterine device within the endometrium. Small amount of fluid in the endometrium is also noted. Right ovary Measurements: 3.3 x 2.7 x 2.7 cm. Normal appearance/no adnexal mass. A physiologic follicle measuring 1.5 x 1.4 cm is present. Left ovary Measurements: 2.2 x 1.9 x 2.0 cm. Normal appearance/no adnexal mass. Pulsed Doppler evaluation of both ovaries demonstrates normal low-resistance arterial and venous waveforms. Other findings No abnormal free fluid. IMPRESSION: Within the endometrium, there is an intrauterine device and small amount of fluid. Endometrium is not thickened, and the contour of the endometrium is smooth. There are several subcentimeter cervical nabothian cysts. Study otherwise unremarkable. No ovarian torsion on either side. Electronically Signed   By: Bretta Bang III M.D.   On: 07/22/2016 11:23   Ct Renal  Stone Study  Result Date: 07/22/2016 CLINICAL DATA:  36 year old female with right flank pain radiating to the pelvis with nausea and vomiting since this morning. Initial encounter. Prior appendectomy. EXAM: CT ABDOMEN AND PELVIS WITHOUT CONTRAST TECHNIQUE: Multidetector CT imaging of the abdomen and pelvis was performed following the standard protocol without IV contrast. COMPARISON:  Noncontrast CT Abdomen and Pelvis 05/13/2014 FINDINGS: Lower chest: Normal lung bases.  No pericardial or pleural effusion. Hepatobiliary: Negative noncontrast liver and gallbladder. Pancreas: Negative. Spleen: Negative. Adrenals/Urinary Tract: Normal adrenal glands. Stable and negative noncontrast appearance of both kidneys. No nephrolithiasis, perinephric stranding or hydronephrosis. No definite hydroureter. Multiple mostly left side pelvic phleboliths appear stable. No definite urologic calculus. No abdominal free fluid.  Stomach/Bowel: Decompressed rectum. Mildly redundant and gas-filled sigmoid colon but no associated inflammation. Negative left colon and splenic flexure. Retained stool in the transverse colon and right colon. Multiple surgical clips re- demonstrated at the tip of the cecum and also in the right distal small bowel mesentery. Solitary surgical clip in the left lower quadrant small bowel mesentery unchanged. No dilated small bowel. Negative stomach and duodenum. Vascular/Lymphatic: Vascular patency is not evaluated in the absence of IV contrast. No lymphadenopathy identified. Reproductive: IUD in place. Otherwise negative noncontrast uterus and adnexa. Other: Small volume of pelvic free fluid mostly in the cul-de-sac. Musculoskeletal: Chronic disc degeneration at the lumbosacral junction. No acute osseous abnormality identified. IMPRESSION: 1. No urologic calculus identified. No evidence of obstructive uropathy. 2. Small volume of pelvic free fluid is likely physiologic. IUD remains in place. 3. Stable right lower  quadrant postoperative changes. Electronically Signed   By: Odessa Fleming M.D.   On: 07/22/2016 13:39    Procedures Procedures (including critical care time)  Medications Ordered in ED Medications  HYDROmorphone (DILAUDID) injection 1 mg (1 mg Intravenous Given 07/22/16 0733)  sodium chloride 0.9 % bolus 1,000 mL (0 mLs Intravenous Stopped 07/22/16 1116)  ondansetron (ZOFRAN) injection 4 mg (4 mg Intravenous Given 07/22/16 0733)  acetaminophen (TYLENOL) tablet 650 mg (650 mg Oral Given 07/22/16 0915)  HYDROmorphone (DILAUDID) injection 1 mg (1 mg Intravenous Given 07/22/16 1200)  ondansetron (ZOFRAN) injection 4 mg (4 mg Intravenous Given 07/22/16 1157)  cefTRIAXone (ROCEPHIN) injection 250 mg (250 mg Intramuscular Given 07/22/16 1505)  doxycycline (VIBRA-TABS) tablet 100 mg (100 mg Oral Given 07/22/16 1525)  lidocaine (PF) (XYLOCAINE) 1 % injection (0.9 mLs  Given 07/22/16 1505)  promethazine (PHENERGAN) tablet 25 mg (25 mg Oral Given 07/22/16 1504)     Initial Impression / Assessment and Plan / ED Course  I have reviewed the triage vital signs and the nursing notes.  Pertinent labs & imaging results that were available during my care of the patient were reviewed by me and considered in my medical decision making (see chart for details).     Pt presenting with right sided pelvic pain as well as right flank pain.  Pt states pain started early this morning.  Urine does not show any RBCs- and patient has hx of ovarian cyst- therefore pelvic ultrasound obtained to evaluate for torsion or other pelvic pathology- this was negative.  Pelvic exam shows CMT and most c/w PID- however patient continues to c/o a lot of right flank pain- CT stone protocol obtained- did not show signs of renal or ureteral stone.  Pt treated with rocephin and doxycycline for PID- she had no further vomiting in the ED. Discharged to finish course of antibiotics- given rx for nausea and pain meds  Discharged with strict return  precautions.  Pt agreeable with plan.  Final Clinical Impressions(s) / ED Diagnoses   Final diagnoses:  Pelvic pain  PID (acute pelvic inflammatory disease)    New Prescriptions Discharge Medication List as of 07/22/2016  3:09 PM    START taking these medications   Details  doxycycline (VIBRAMYCIN) 100 MG capsule Take 1 capsule (100 mg total) by mouth 2 (two) times daily., Starting Mon 07/22/2016, Print    ondansetron (ZOFRAN ODT) 4 MG disintegrating tablet Take 1 tablet (4 mg total) by mouth every 8 (eight) hours as needed., Starting Mon 07/22/2016, Print    oxyCODONE-acetaminophen (PERCOCET/ROXICET) 5-325 MG tablet Take 1-2 tablets by mouth every 6 (six) hours as needed for  severe pain., Starting Mon 07/22/2016, Print    promethazine (PHENERGAN) 25 MG tablet Take 1 tablet (25 mg total) by mouth every 6 (six) hours as needed for nausea or vomiting., Starting Mon 07/22/2016, Print         Jerelyn Scott, MD 07/22/16 1610

## 2016-07-23 LAB — GC/CHLAMYDIA PROBE AMP (~~LOC~~) NOT AT ARMC
Chlamydia: NEGATIVE
NEISSERIA GONORRHEA: NEGATIVE

## 2016-07-23 LAB — HIV ANTIBODY (ROUTINE TESTING W REFLEX): HIV SCREEN 4TH GENERATION: NONREACTIVE

## 2019-11-12 ENCOUNTER — Emergency Department (HOSPITAL_COMMUNITY): Payer: BC Managed Care – PPO

## 2019-11-12 ENCOUNTER — Other Ambulatory Visit: Payer: Self-pay

## 2019-11-12 ENCOUNTER — Encounter (HOSPITAL_COMMUNITY): Payer: Self-pay | Admitting: Emergency Medicine

## 2019-11-12 ENCOUNTER — Emergency Department (HOSPITAL_COMMUNITY)
Admission: EM | Admit: 2019-11-12 | Discharge: 2019-11-12 | Disposition: A | Discharge status: Home / Self Care | Payer: BC Managed Care – PPO | Attending: Emergency Medicine | Admitting: Emergency Medicine

## 2019-11-12 DIAGNOSIS — N3091 Cystitis, unspecified with hematuria: Secondary | ICD-10-CM | POA: Diagnosis not present

## 2019-11-12 DIAGNOSIS — R112 Nausea with vomiting, unspecified: Secondary | ICD-10-CM | POA: Diagnosis not present

## 2019-11-12 DIAGNOSIS — Z79899 Other long term (current) drug therapy: Secondary | ICD-10-CM | POA: Insufficient documentation

## 2019-11-12 DIAGNOSIS — R109 Unspecified abdominal pain: Secondary | ICD-10-CM | POA: Diagnosis present

## 2019-11-12 DIAGNOSIS — N390 Urinary tract infection, site not specified: Secondary | ICD-10-CM

## 2019-11-12 DIAGNOSIS — Z87891 Personal history of nicotine dependence: Secondary | ICD-10-CM | POA: Insufficient documentation

## 2019-11-12 LAB — URINALYSIS, ROUTINE W REFLEX MICROSCOPIC
Bilirubin Urine: NEGATIVE
Glucose, UA: NEGATIVE mg/dL
Ketones, ur: 5 mg/dL — AB
Nitrite: NEGATIVE
Protein, ur: NEGATIVE mg/dL
Specific Gravity, Urine: 1.021 (ref 1.005–1.030)
pH: 5 (ref 5.0–8.0)

## 2019-11-12 LAB — CBC
HCT: 40.8 % (ref 36.0–46.0)
Hemoglobin: 13.8 g/dL (ref 12.0–15.0)
MCH: 31.6 pg (ref 26.0–34.0)
MCHC: 33.8 g/dL (ref 30.0–36.0)
MCV: 93.4 fL (ref 80.0–100.0)
Platelets: 277 10*3/uL (ref 150–400)
RBC: 4.37 MIL/uL (ref 3.87–5.11)
RDW: 12.8 % (ref 11.5–15.5)
WBC: 16.9 10*3/uL — ABNORMAL HIGH (ref 4.0–10.5)
nRBC: 0 % (ref 0.0–0.2)

## 2019-11-12 LAB — COMPREHENSIVE METABOLIC PANEL
ALT: 34 U/L (ref 0–44)
AST: 29 U/L (ref 15–41)
Albumin: 4.3 g/dL (ref 3.5–5.0)
Alkaline Phosphatase: 79 U/L (ref 38–126)
Anion gap: 14 (ref 5–15)
BUN: 17 mg/dL (ref 6–20)
CO2: 18 mmol/L — ABNORMAL LOW (ref 22–32)
Calcium: 9.8 mg/dL (ref 8.9–10.3)
Chloride: 106 mmol/L (ref 98–111)
Creatinine, Ser: 0.92 mg/dL (ref 0.44–1.00)
GFR calc Af Amer: >60 mL/min (ref >60)
GFR calc non Af Amer: >60 mL/min (ref >60)
Glucose, Bld: 160 mg/dL — ABNORMAL HIGH (ref 70–99)
Potassium: 3.3 mmol/L — ABNORMAL LOW (ref 3.5–5.1)
Sodium: 138 mmol/L (ref 135–145)
Total Bilirubin: 0.9 mg/dL (ref 0.3–1.2)
Total Protein: 7.2 g/dL (ref 6.5–8.1)

## 2019-11-12 LAB — I-STAT BETA HCG BLOOD, ED (MC, WL, AP ONLY): I-stat hCG, quantitative: <5 m[IU]/mL (ref <5)

## 2019-11-12 LAB — LIPASE, BLOOD: Lipase: 70 U/L — ABNORMAL HIGH (ref 11–51)

## 2019-11-12 MED ORDER — SODIUM CHLORIDE 0.9% FLUSH
3.0000 mL | Freq: Once | INTRAVENOUS | Status: AC
  Administered 2019-11-12: 3 mL via INTRAVENOUS

## 2019-11-12 MED ORDER — FENTANYL CITRATE (PF) 100 MCG/2ML IJ SOLN
100.0000 ug | Freq: Once | INTRAMUSCULAR | Status: AC
  Administered 2019-11-12: 100 ug via INTRAVENOUS
  Filled 2019-11-12: qty 2

## 2019-11-12 MED ORDER — SODIUM CHLORIDE 0.9 % IV BOLUS
1000.0000 mL | Freq: Once | INTRAVENOUS | Status: AC
  Administered 2019-11-12: 1000 mL via INTRAVENOUS

## 2019-11-12 MED ORDER — CEPHALEXIN 500 MG PO CAPS: 500.0000 mg | ORAL_CAPSULE | Freq: Two times a day (BID) | ORAL | 0 refills | Status: AC

## 2019-11-12 MED ORDER — FENTANYL CITRATE (PF) 100 MCG/2ML IJ SOLN
50.0000 ug | Freq: Once | INTRAMUSCULAR | Status: AC
Start: 2019-11-12 — End: 2019-11-12
  Administered 2019-11-12: 50 ug via INTRAVENOUS
  Filled 2019-11-12: qty 2

## 2019-11-12 MED ORDER — ONDANSETRON 4 MG PO TBDP
4.0000 mg | ORAL_TABLET | Freq: Once | ORAL | Status: AC
  Administered 2019-11-12: 4 mg via ORAL
  Filled 2019-11-12: qty 1

## 2019-11-12 MED ORDER — SODIUM CHLORIDE 0.9 % IV SOLN
1.0000 g | Freq: Once | INTRAVENOUS | Status: AC
  Administered 2019-11-12: 1 g via INTRAVENOUS
  Filled 2019-11-12: qty 10

## 2019-11-12 MED ORDER — ONDANSETRON HCL 4 MG/2ML IJ SOLN
4.0000 mg | Freq: Once | INTRAMUSCULAR | Status: AC
  Administered 2019-11-12: 4 mg via INTRAVENOUS
  Filled 2019-11-12: qty 2

## 2019-11-12 MED ORDER — ONDANSETRON 4 MG PO TBDP: 4.0000 mg | ORAL_TABLET | Freq: Three times a day (TID) | ORAL | 0 refills | Status: AC | PRN

## 2019-11-12 MED ORDER — KETOROLAC TROMETHAMINE 30 MG/ML IJ SOLN
30.0000 mg | Freq: Once | INTRAMUSCULAR | Status: AC
  Administered 2019-11-12: 30 mg via INTRAVENOUS
  Filled 2019-11-12: qty 1

## 2019-11-12 MED ORDER — MELOXICAM 7.5 MG PO TABS: 7.5000 mg | ORAL_TABLET | Freq: Two times a day (BID) | ORAL | 0 refills | Status: AC | PRN

## 2019-11-12 NOTE — ED Provider Notes (Signed)
[facility] EMERGENCY DEPARTMENT Provider Note   CSN: [id number] Arrival date & time: 11/12/19  0303     History Chief Complaint  Patient presents with  . Emesis    Jennifer Patterson is a 39 y.o. female.  HPI   39 y/o female - presents with c/o pain in her back with vomiting - at 2 AM, feels like her last kidney infection (12 years ago), multiple episodes of NB emesis.  Feverish but not taking temp, has normally had UTI's without sx (in the past), no urinary sx now.  No meds prior to arrival - zofran on arrival without relief.  She has severe R sided back pain to the touch, no vaginal sx - no daily meds - no allergies.   Sx are constant, worse with movement, associated subjective fevers.  Past Medical History:  Diagnosis Date  . Allergy   . Anemia    during pregnancy  . Chlamydia   . Contraception management    6 pregnancies, 1 SAB, 3 TAB, 2 live births; failed [place]  and Depo Provera prior due to adverse effects  . Ovarian cyst    history of  . PID (pelvic inflammatory disease)   . Preterm labor   . UTI (urinary tract infection)    frequent during pregnancy    Patient Active Problem List   Diagnosis Date Noted  . Obesity 11/03/2010  . Dizziness 09/10/2010    Past Surgical History:  Procedure Laterality Date  . APPENDECTOMY  2002  . DILATION AND CURETTAGE OF UTERUS    . HERNIA REPAIR     umbilical  . TONSILLECTOMY       OB History    Gravida  7   Para  4   Term  2   Preterm  2   AB  3   Living  3     SAB  1   TAB  2   Ectopic      Multiple      Live Births  4           Family History  Problem Relation Age of Onset  . Cancer Mother 30       cervical  . Hypertension Mother   . Thyroid disease Mother   . Depression Other   . Diabetes Other        paternal side  . Hypertension Paternal Grandmother   . Diabetes Paternal Grandmother   . Stroke Maternal Aunt   . Cancer Paternal Grandfather        colon  .  Heart disease Neg Hx     Social History   Tobacco Use  . Smoking status: Former Smoker    Quit date: 05/06/2005    Years since quitting: 14.5  . Smokeless tobacco: Never Used  Substance Use Topics  . Alcohol use: No  . Drug use: No    Home Medications Prior to Admission medications   Medication Sig Start Date End Date Taking? Authorizing Provider  levocetirizine (XYZAL) 5 MG tablet Take 5 mg by mouth daily.   Yes [provider]  levonorgestrel (MIRENA, 52 MG,) 20 MCG/24HR IUD 1 each by Intrauterine route once.   Yes [provider]  Multiple Vitamin (MULTIVITAMIN ADULT PO) Take 2 tablets by mouth daily. gummies   Yes [provider]  cephALEXin (KEFLEX) 500 MG capsule Take 1 capsule (500 mg total) by mouth 2 (two) times daily for 7 days. 11/12/19 11/19/19  Eber Hong, MD  meloxicam (MOBIC) 7.5 MG tablet Take 1 tablet (7.5 mg total) by mouth 2 (two) times daily as needed for up to 14 days for pain. 11/12/19 11/26/19  Eber HongMiller, Leanette Eutsler, MD  ondansetron (ZOFRAN ODT) 4 MG disintegrating tablet Take 1 tablet (4 mg total) by mouth every 8 (eight) hours as needed for nausea. 11/12/19   Eber HongMiller, Dayton Sherr, MD  promethazine (PHENERGAN) 25 MG tablet Take 1 tablet (25 mg total) by mouth every 6 (six) hours as needed for nausea or vomiting. Patient not taking: Reported on 11/12/2019 07/22/16 11/12/19  Phillis HaggisMabe, Martha L, MD    Allergies    Patient has no known allergies.  Review of Systems   Review of Systems  All other systems reviewed and are negative.   Physical Exam Updated Vital Signs BP (!) 107/58   Pulse 64   Temp 97.6 F (36.4 C) (Oral)   Resp 16   Ht 1.727 m (5\' 8" )   Wt 90 kg   SpO2 98%   BMI 30.17 kg/m   Physical Exam Vitals and nursing note reviewed.  Constitutional:      General: She is not in acute distress.    Appearance: She is well-developed.  HENT:     Head: Normocephalic and atraumatic.     Mouth/Throat:     Pharynx: No oropharyngeal exudate.  Eyes:      General: No scleral icterus.       Right eye: No discharge.        Left eye: No discharge.     Conjunctiva/sclera: Conjunctivae normal.     Pupils: Pupils are equal, round, and reactive to light.  Neck:     Thyroid: No thyromegaly.     Vascular: No JVD.  Cardiovascular:     Rate and Rhythm: Normal rate and regular rhythm.     Heart sounds: Normal heart sounds. No murmur heard.  No friction rub. No gallop.   Pulmonary:     Effort: Pulmonary effort is normal. No respiratory distress.     Breath sounds: Normal breath sounds. No wheezing or rales.  Abdominal:     General: Bowel sounds are normal. There is no distension.     Palpations: Abdomen is soft. There is no mass.     Tenderness: There is no abdominal tenderness.     Comments: R sided CVA ttp.  Musculoskeletal:        General: No tenderness. Normal range of motion.     Cervical back: Normal range of motion and neck supple.  Lymphadenopathy:     Cervical: No cervical adenopathy.  Skin:    General: Skin is warm and dry.     Findings: No erythema or rash.  Neurological:     Mental Status: She is alert.     Coordination: Coordination normal.     Comments: MAEX4  Psychiatric:        Behavior: Behavior normal.     ED Results / Procedures / Treatments   Labs (all labs ordered are listed, but only abnormal results are displayed) Labs Reviewed  LIPASE, BLOOD - Abnormal; Notable for the following components:      Result Value   Lipase 70 (*)    All other components within normal limits  COMPREHENSIVE METABOLIC PANEL - Abnormal; Notable for the following components:   Potassium 3.3 (*)    CO2 18 (*)    Glucose, Bld 160 (*)    All other components within normal limits  CBC - Abnormal; Notable for the following  components:   WBC 16.9 (*)    All other components within normal limits  URINALYSIS, ROUTINE W REFLEX MICROSCOPIC - Abnormal; Notable for the following components:   APPearance HAZY (*)    Hgb urine dipstick  MODERATE (*)    Ketones, ur 5 (*)    Leukocytes,Ua SMALL (*)    Bacteria, UA FEW (*)    All other components within normal limits  I-STAT BETA HCG BLOOD, ED ([facility], [facility], [facility] ONLY)    EKG None  Radiology CT Renal Stone Study  Result Date: 11/12/2019 CLINICAL DATA:  Acute flank pain. EXAM: CT ABDOMEN AND PELVIS WITHOUT CONTRAST TECHNIQUE: Multidetector CT imaging of the abdomen and pelvis was performed following the standard protocol without IV contrast. COMPARISON:  July 22, 2016. FINDINGS: Lower chest: No acute abnormality. Hepatobiliary: No focal liver abnormality is seen. No gallstones, gallbladder wall thickening, or biliary dilatation. Pancreas: Unremarkable. No pancreatic ductal dilatation or surrounding inflammatory changes. Spleen: Normal in size without focal abnormality. Adrenals/Urinary Tract: Adrenal glands are unremarkable. Kidneys are normal, without renal calculi, focal lesion, or hydronephrosis. Bladder is unremarkable. Stomach/Bowel: Stomach appears normal. Status post appendectomy. There is no evidence of bowel obstruction or inflammation. Vascular/Lymphatic: No significant vascular findings are present. No enlarged abdominal or pelvic lymph nodes. Reproductive: Intrauterine device is noted. No adnexal abnormality is noted. Other: Small fat containing periumbilical hernia is noted. No ascites is noted. Musculoskeletal: No acute or significant osseous findings. IMPRESSION: Small fat containing periumbilical hernia. No other abnormality seen in the abdomen or pelvis. Electronically Signed   By: Lupita Raider M.D.   On: 11/12/2019 09:19    Procedures Procedures (including critical care time)  Medications Ordered in ED Medications  ondansetron (ZOFRAN-ODT) disintegrating tablet 4 mg (4 mg Oral Given 11/12/19 0315)  sodium chloride flush (NS) 0.9 % injection 3 mL (3 mLs Intravenous Given 11/12/19 0813)  ondansetron (ZOFRAN) injection 4 mg (4 mg Intravenous Given 11/12/19 0804)  fentaNYL  (SUBLIMAZE) injection 100 mcg (100 mcg Intravenous Given 11/12/19 0805)  sodium chloride 0.9 % bolus 1,000 mL (1,000 mLs Intravenous New Bag/Given 11/12/19 0919)  cefTRIAXone (ROCEPHIN) 1 g in sodium chloride 0.9 % 100 mL IVPB (0 g Intravenous Stopped 11/12/19 1109)  ketorolac (TORADOL) 30 MG/ML injection 30 mg (30 mg Intravenous Given 11/12/19 1027)  fentaNYL (SUBLIMAZE) injection 50 mcg (50 mcg Intravenous Given 11/12/19 1030)    ED Course  I have reviewed the triage vital signs and the nursing notes.  Pertinent labs & imaging results that were available during my care of the patient were reviewed by me and considered in my medical decision making (see chart for details).    MDM Rules/Calculators/A&P                           This patient presents to the ED for concern of flank pain, this involves an extensive number of treatment options, and is a complaint that carries with it a high risk of complications and morbidity.  The differential diagnosis includes kidney stone, pyelonephritis, shingles  The patient is very uncomfortable, she is tender to even light touch over the flank and vomiting profusely.  I suspect that this is renal colic but will need a CT scan to further evaluate since she has never had a kidney stone before.  The urinalysis does reveal 11-20 white blood cells a few bacteria and some red cells as well though it appears contaminated with squamous cells.  She  does have slight ketones, will give fluids and medications and reeval   Lab Tests:   I Ordered, reviewed, and interpreted labs, which included urinalysis CBC metabolic panel lipase and urine hCG  Medicines ordered:   I ordered medication Zofran and fentanyl for pain and nausea  Imaging Studies ordered:   I ordered imaging studies which included CT scan renal protocol and  I independently visualized and interpreted imaging which showed no acute findings, no kidney stones, no hydronephrosis what is going on with this  patient  Additional history obtained:   Additional history obtained from medical record and the spouse at the bedside  Previous records obtained and reviewed   Consultations Obtained:   I consulted with the patient and her significant other and discussed lab and imaging findings  Reevaluation:  After the interventions stated above, I reevaluated the patient and found significant improvement, she required multiple doses of medication including Toradol, opiates, antiemetics and antibiotics with IV fluids but did very well and improved to the point where she felt comfortable going home  Critical Interventions:  CT scan to rule out more serious causes of symptoms including kidney stones pyelonephritis or renal abscess   Final Clinical Impression(s) / ED Diagnoses Final diagnoses:  Flank pain  Urinary tract infection with hematuria, site unspecified    Rx / DC Orders ED Discharge Orders         Ordered    cephALEXin (KEFLEX) 500 MG capsule  2 times daily     Discontinue  Reprint     11/12/19 1143    meloxicam (MOBIC) 7.5 MG tablet  2 times daily PRN     Discontinue  Reprint     11/12/19 1143    ondansetron (ZOFRAN ODT) 4 MG disintegrating tablet  Every 8 hours PRN     Discontinue  Reprint     11/12/19 1143           Eber Hong, MD 11/12/19 1145

## 2019-11-12 NOTE — Discharge Instructions (Signed)
Your testing today has not given a definite answer as to the cause of your symptoms but I suspect there is some element of a urinary tract infection.  Rest assured that your CT scan did not show any pathologic findings or obvious kidney stones.  Please take cephalexin 1 tablet by mouth twice a day for 7 days  Seek medical exam for severe or worsening symptoms, Mobic for pain twice a day as needed, Zofran as needed every 6 hours.

## 2019-11-12 NOTE — ED Notes (Signed)
Patient verbalizes understanding of discharge instructions. Opportunity for questioning and answers were provided. Pt discharged from ED. 

## 2019-11-12 NOTE — ED Triage Notes (Signed)
Patient reports emesis with mid abdominal pain onset this evening , no diarrhea or fever .

## 2021-01-06 IMAGING — CT CT RENAL STONE PROTOCOL
2 of 4 series · 16 of 46 positions shown, 18 images · non-contrast
Comparison: July 22, 2016.

CLINICAL DATA: Acute flank pain.

EXAM:
CT ABDOMEN AND PELVIS WITHOUT CONTRAST
TECHNIQUE: Multidetector CT imaging of the abdomen and pelvis was performed
following the standard protocol without IV contrast.

[Series 3: ap without · axial · non-contrast · 0.79mm/px · z∈[-774,-364]mm · 13 of 94 slices shown, 15 images]
[im 6/94  soft-tissue]
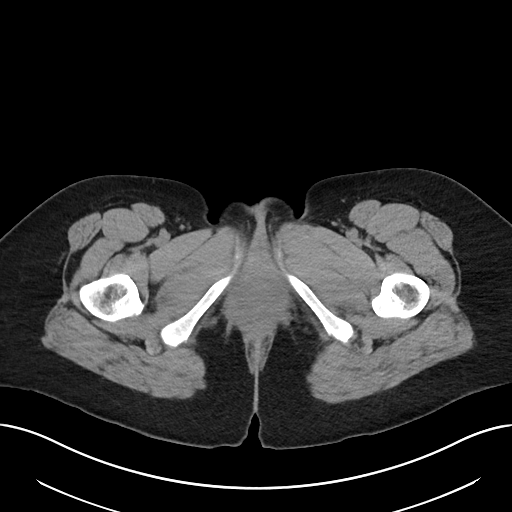
[im 6/94  bone]
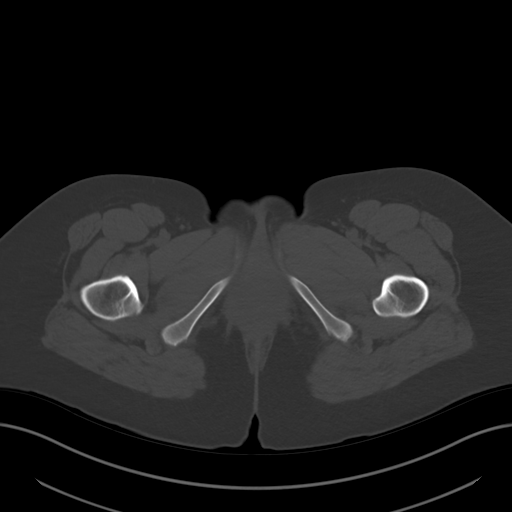
[im 11/94  soft-tissue]
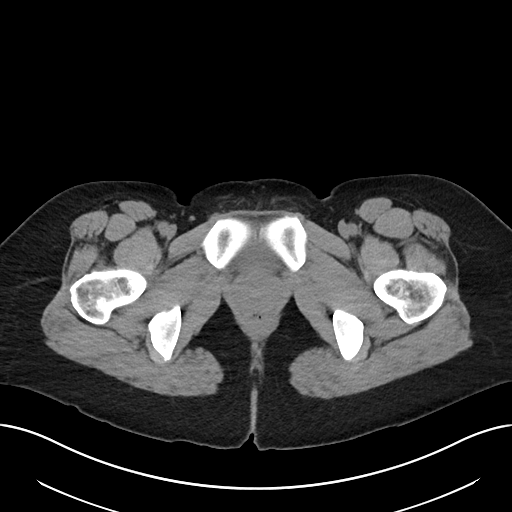
[im 22/94  soft-tissue]
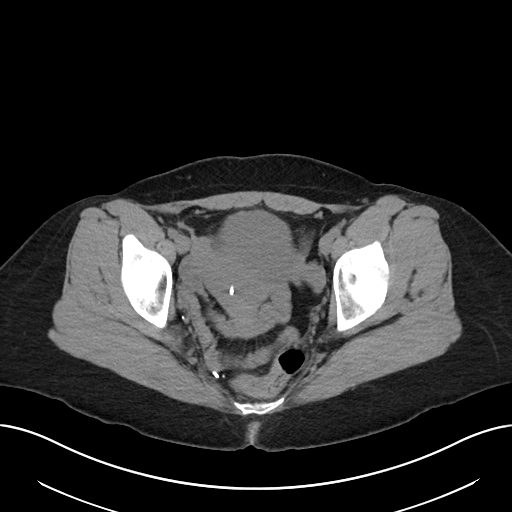
[im 28/94  soft-tissue]
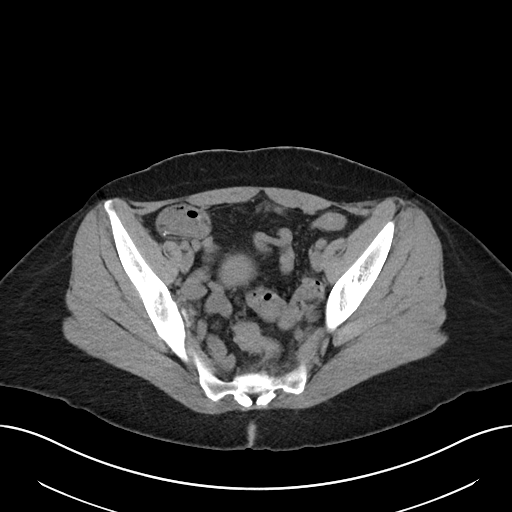
[im 33/94  soft-tissue]
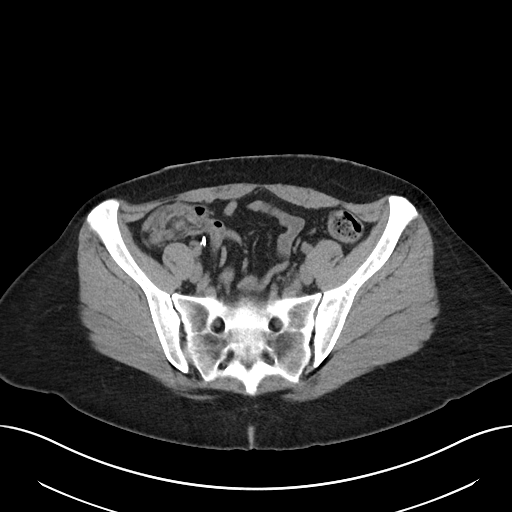
[im 39/94  soft-tissue]
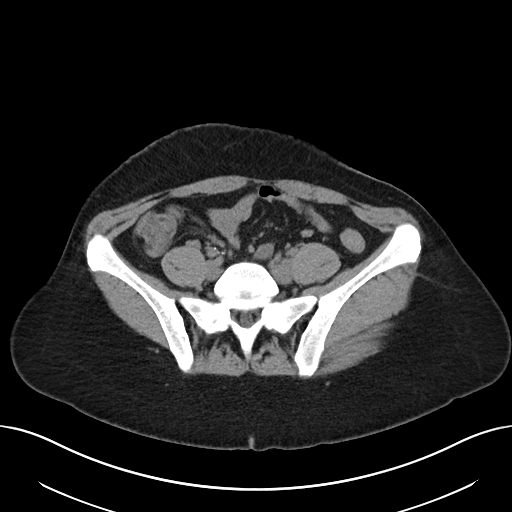
[im 50/94  soft-tissue]
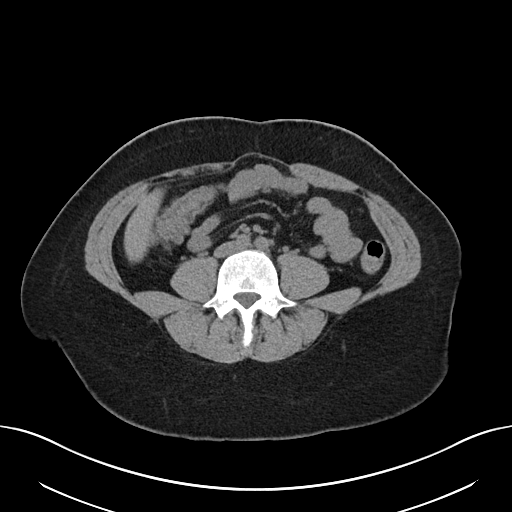
[im 55/94  soft-tissue]
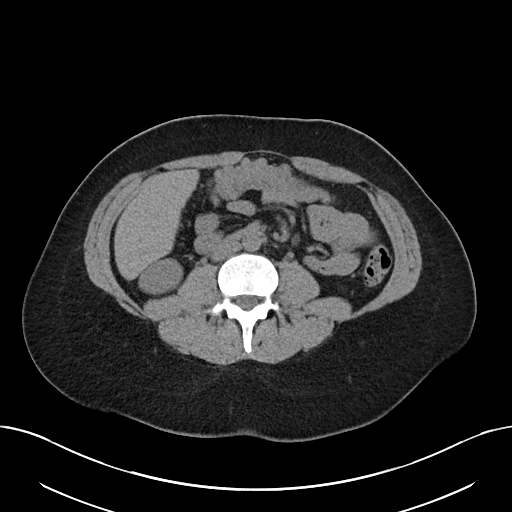
[im 61/94  soft-tissue]
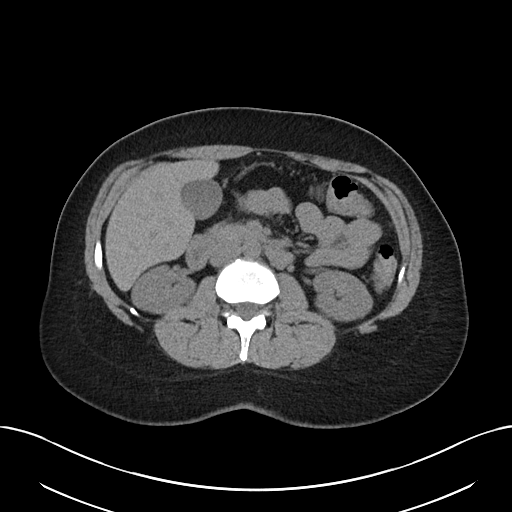
[im 61/94  bone]
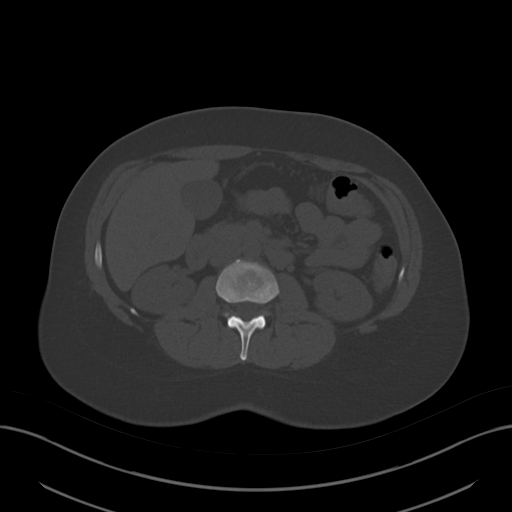
[im 66/94  soft-tissue]
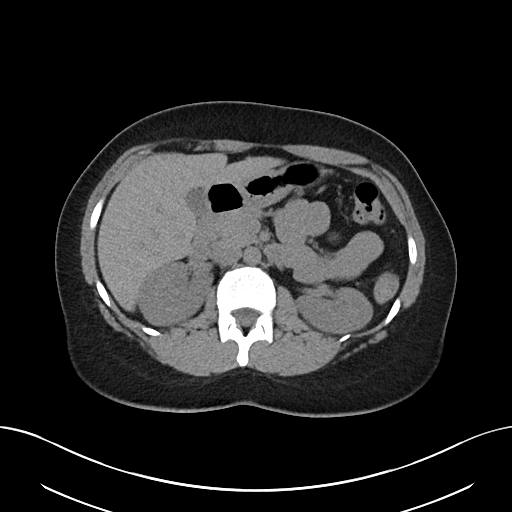
[im 72/94  soft-tissue]
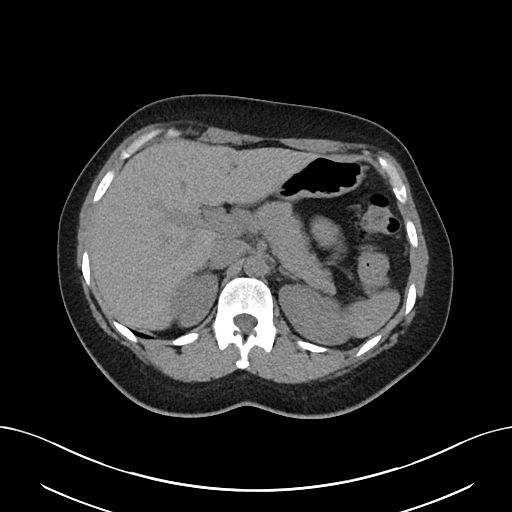
[im 83/94  soft-tissue]
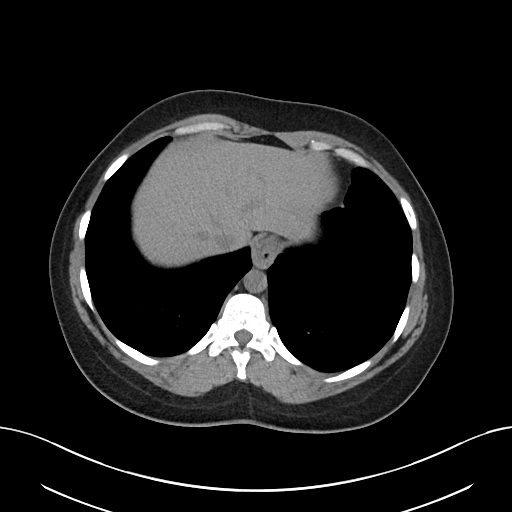
[im 88/94  soft-tissue]
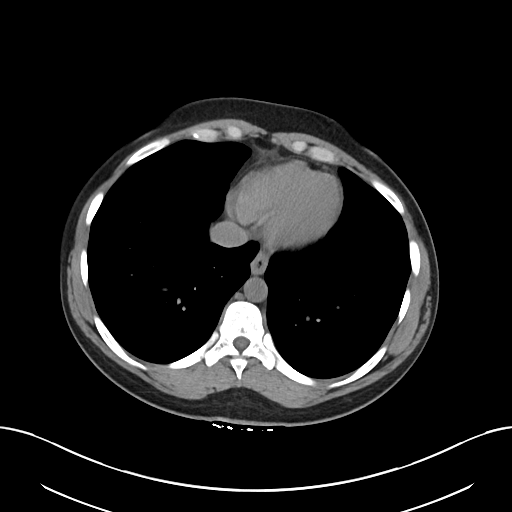

[Series 6: cor · coronal · 0.68mm/px · 3 of 97 slices shown]
[im 33/97  soft-tissue]
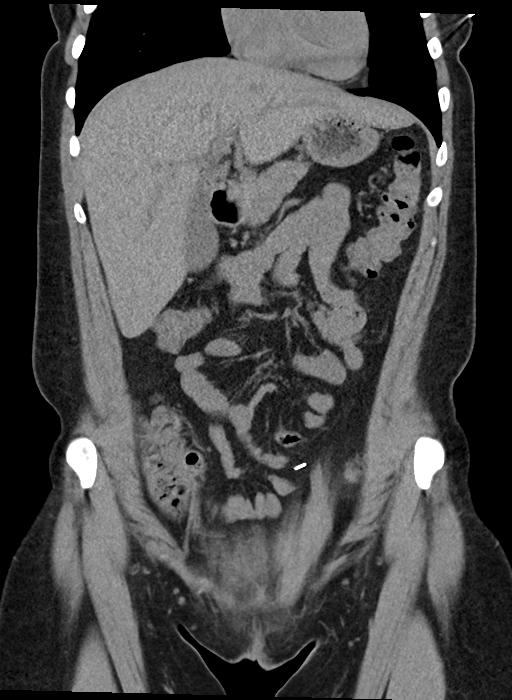
[im 43/97  soft-tissue]
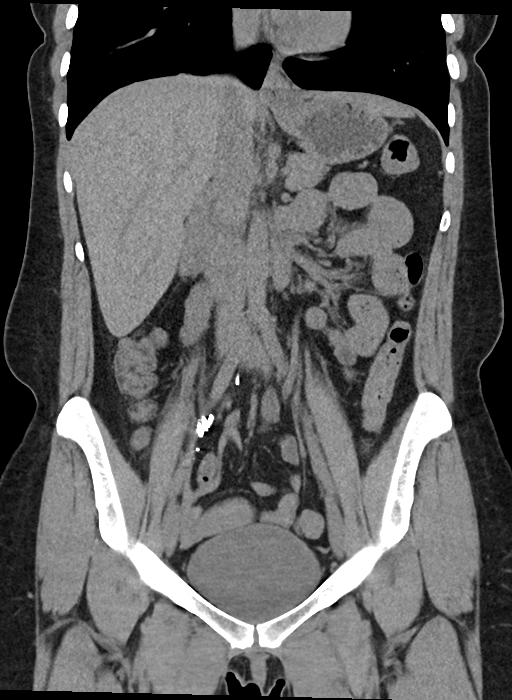
[im 54/97  soft-tissue]
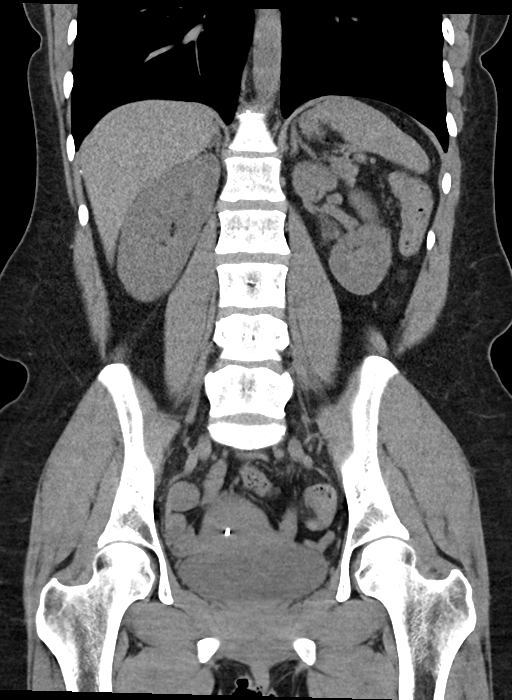

[16 of 46 positions shown; findings below may reference images not displayed]

FINDINGS: Lower chest: No acute abnormality.

Hepatobiliary: No focal liver abnormality is seen. No gallstones,
gallbladder wall thickening, or biliary dilatation.

Pancreas: Unremarkable. No pancreatic ductal dilatation or
surrounding inflammatory changes.

Spleen: Normal in size without focal abnormality.

Adrenals/Urinary Tract: Adrenal glands are unremarkable. Kidneys are
normal, without renal calculi, focal lesion, or hydronephrosis.
Bladder is unremarkable.

Stomach/Bowel: Stomach appears normal. Status post appendectomy.
There is no evidence of bowel obstruction or inflammation.

Vascular/Lymphatic: No significant vascular findings are present. No
enlarged abdominal or pelvic lymph nodes.

Reproductive: Intrauterine device is noted. No adnexal abnormality
is noted.

Other: Small fat containing periumbilical hernia is noted. No
ascites is noted.

Musculoskeletal: No acute or significant osseous findings.
IMPRESSION: Small fat containing periumbilical hernia. No other abnormality seen
in the abdomen or pelvis.
